# Patient Record
Sex: Female | Born: 1955
Health system: Southern US, Community
[De-identification: ages and names within clinical notes are randomized; demographics above are authoritative.]

## PROBLEM LIST (undated history)

## (undated) DIAGNOSIS — J302 Other seasonal allergic rhinitis: Secondary | ICD-10-CM

## (undated) DIAGNOSIS — M1990 Unspecified osteoarthritis, unspecified site: Secondary | ICD-10-CM

## (undated) DIAGNOSIS — E785 Hyperlipidemia, unspecified: Secondary | ICD-10-CM

## (undated) DIAGNOSIS — K219 Gastro-esophageal reflux disease without esophagitis: Secondary | ICD-10-CM

## (undated) HISTORY — PX: BUNIONECTOMY: SHX129

## (undated) HISTORY — PX: COLONOSCOPY: SHX174

---

## 2008-11-02 ENCOUNTER — Ambulatory Visit: Payer: Self-pay | Admitting: Cardiology

## 2019-01-23 DIAGNOSIS — M1611 Unilateral primary osteoarthritis, right hip: Secondary | ICD-10-CM | POA: Diagnosis not present

## 2019-01-23 DIAGNOSIS — Z299 Encounter for prophylactic measures, unspecified: Secondary | ICD-10-CM | POA: Diagnosis not present

## 2019-01-23 DIAGNOSIS — I10 Essential (primary) hypertension: Secondary | ICD-10-CM | POA: Diagnosis not present

## 2019-01-23 DIAGNOSIS — Z789 Other specified health status: Secondary | ICD-10-CM | POA: Diagnosis not present

## 2019-01-23 DIAGNOSIS — Z6824 Body mass index (BMI) 24.0-24.9, adult: Secondary | ICD-10-CM | POA: Diagnosis not present

## 2019-01-23 DIAGNOSIS — E7800 Pure hypercholesterolemia, unspecified: Secondary | ICD-10-CM | POA: Diagnosis not present

## 2019-02-01 DIAGNOSIS — I10 Essential (primary) hypertension: Secondary | ICD-10-CM | POA: Diagnosis not present

## 2019-02-01 DIAGNOSIS — Z6826 Body mass index (BMI) 26.0-26.9, adult: Secondary | ICD-10-CM | POA: Diagnosis not present

## 2019-02-01 DIAGNOSIS — Z299 Encounter for prophylactic measures, unspecified: Secondary | ICD-10-CM | POA: Diagnosis not present

## 2019-02-01 DIAGNOSIS — Z79899 Other long term (current) drug therapy: Secondary | ICD-10-CM | POA: Diagnosis not present

## 2019-02-01 DIAGNOSIS — R5383 Other fatigue: Secondary | ICD-10-CM | POA: Diagnosis not present

## 2019-02-01 DIAGNOSIS — Z1339 Encounter for screening examination for other mental health and behavioral disorders: Secondary | ICD-10-CM | POA: Diagnosis not present

## 2019-02-01 DIAGNOSIS — Z1211 Encounter for screening for malignant neoplasm of colon: Secondary | ICD-10-CM | POA: Diagnosis not present

## 2019-02-01 DIAGNOSIS — Z0000 Encounter for general adult medical examination without abnormal findings: Secondary | ICD-10-CM | POA: Diagnosis not present

## 2019-02-01 DIAGNOSIS — Z7189 Other specified counseling: Secondary | ICD-10-CM | POA: Diagnosis not present

## 2019-02-01 DIAGNOSIS — E7800 Pure hypercholesterolemia, unspecified: Secondary | ICD-10-CM | POA: Diagnosis not present

## 2019-02-01 DIAGNOSIS — Z1331 Encounter for screening for depression: Secondary | ICD-10-CM | POA: Diagnosis not present

## 2019-02-03 DIAGNOSIS — Z1231 Encounter for screening mammogram for malignant neoplasm of breast: Secondary | ICD-10-CM | POA: Diagnosis not present

## 2019-02-07 DIAGNOSIS — E2839 Other primary ovarian failure: Secondary | ICD-10-CM | POA: Diagnosis not present

## 2019-02-21 ENCOUNTER — Encounter (HOSPITAL_COMMUNITY): Payer: Self-pay

## 2019-02-21 NOTE — Patient Instructions (Addendum)
Your procedure is scheduled on: Tuesday, February 28, 2019   Surgery Time:  12:55PM-2:05PM   Report to [place]  Entrance    Report to admitting at 10:15 AM   Call this number if you have problems the morning of surgery [phone]   Do not eat food or drink liquids :After Midnight.   Brush your teeth the morning of surgery.   Do NOT smoke after Midnight   Take these medicines the morning of surgery with A SIP OF WATER: None                               You may not have any metal on your body including hair pins, jewelry, and body piercings             Do not wear make-up, lotions, powders, perfumes/cologne, or deodorant             Do not wear nail polish.  Do not shave  48 hours prior to surgery.                Do not bring valuables to the hospital. [place].   Contacts, dentures or bridgework may not be worn into surgery.   Leave suitcase in the car. After surgery it may be brought to your room.    Special Instructions: Bring a copy of your healthcare power of attorney and living will documents         the day of surgery if you haven't scanned them in before.              Please read over the following fact sheets you were given:  [facility] - Preparing for Surgery Before surgery, you can play an important role.  Because skin is not sterile, your skin needs to be as free of germs as possible.  You can reduce the number of germs on your skin by washing with CHG (chlorahexidine gluconate) soap before surgery.  CHG is an antiseptic cleaner which kills germs and bonds with the skin to continue killing germs even after washing. Please DO NOT use if you have an allergy to CHG or antibacterial soaps.  If your skin becomes reddened/irritated stop using the CHG and inform your nurse when you arrive at Short Stay. Do not shave (including legs and underarms) for at least 48 hours prior to the first CHG shower.  You  may shave your face/neck.  Please follow these instructions carefully:  1.  Shower with CHG Soap the night before surgery and the  morning of surgery.  2.  If you choose to wash your hair, wash your hair first as usual with your normal  shampoo.  3.  After you shampoo, rinse your hair and body thoroughly to remove the shampoo.                             4.  Use CHG as you would any other liquid soap.  You can apply chg directly to the skin and wash.  Gently with a scrungie or clean washcloth.  5.  Apply the CHG Soap to your body ONLY FROM THE NECK DOWN.   Do   not use on face/ open  Wound or open sores. Avoid contact with eyes, ears mouth and   genitals (private parts).                       Wash face,  Genitals (private parts) with your normal soap.             6.  Wash thoroughly, paying special attention to the area where your    surgery  will be performed.  7.  Thoroughly rinse your body with warm water from the neck down.  8.  DO NOT shower/wash with your normal soap after using and rinsing off the CHG Soap.                9.  Pat yourself dry with a clean towel.            10.  Wear clean pajamas.            11.  Place clean sheets on your bed the night of your first shower and do not  sleep with pets. Day of Surgery : Do not apply any lotions/deodorants the morning of surgery.  Please wear clean clothes to the hospital/surgery center.  FAILURE TO FOLLOW THESE INSTRUCTIONS MAY RESULT IN THE CANCELLATION OF YOUR SURGERY  PATIENT SIGNATURE_________________________________  NURSE SIGNATURE__________________________________  ________________________________________________________________________   Adam Phenix  An incentive spirometer is a tool that can help keep your lungs clear and active. This tool measures how well you are filling your lungs with each breath. Taking long deep breaths may help reverse or decrease the chance of developing breathing  (pulmonary) problems (especially infection) following:  A long period of time when you are unable to move or be active. BEFORE THE PROCEDURE   If the spirometer includes an indicator to show your best effort, your nurse or respiratory therapist will set it to a desired goal.  If possible, sit up straight or lean slightly forward. Try not to slouch.  Hold the incentive spirometer in an upright position. INSTRUCTIONS FOR USE  1. Sit on the edge of your bed if possible, or sit up as far as you can in bed or on a chair. 2. Hold the incentive spirometer in an upright position. 3. Breathe out normally. 4. Place the mouthpiece in your mouth and seal your lips tightly around it. 5. Breathe in slowly and as deeply as possible, raising the piston or the ball toward the top of the column. 6. Hold your breath for 3-5 seconds or for as long as possible. Allow the piston or ball to fall to the bottom of the column. 7. Remove the mouthpiece from your mouth and breathe out normally. 8. Rest for a few seconds and repeat Steps 1 through 7 at least 10 times every 1-2 hours when you are awake. Take your time and take a few normal breaths between deep breaths. 9. The spirometer may include an indicator to show your best effort. Use the indicator as a goal to work toward during each repetition. 10. After each set of 10 deep breaths, practice coughing to be sure your lungs are clear. If you have an incision (the cut made at the time of surgery), support your incision when coughing by placing a pillow or rolled up towels firmly against it. Once you are able to get out of bed, walk around indoors and cough well. You may stop using the incentive spirometer when instructed by your caregiver.  RISKS AND COMPLICATIONS  Take your time  so you do not get dizzy or light-headed.  If you are in pain, you may need to take or ask for pain medication before doing incentive spirometry. It is harder to take a deep breath if you  are having pain. AFTER USE  Rest and breathe slowly and easily.  It can be helpful to keep track of a log of your progress. Your caregiver can provide you with a simple table to help with this. If you are using the spirometer at home, follow these instructions: [place] IF:   You are having difficultly using the spirometer.  You have trouble using the spirometer as often as instructed.  Your pain medication is not giving enough relief while using the spirometer.  You develop fever of 100.5 F (38.1 C) or higher. SEEK IMMEDIATE MEDICAL CARE IF:   You cough up bloody sputum that had not been present before.  You develop fever of 102 F (38.9 C) or greater.  You develop worsening pain at or near the incision site. MAKE SURE YOU:   Understand these instructions.  Will watch your condition.  Will get help right away if you are not doing well or get worse. Document Released: 04/19/2007 Document Revised: 02/29/2012 Document Reviewed: 06/20/2007 ExitCare Patient Information 2014 Exit[employer], [place].   ________________________________________________________________________  WHAT IS A BLOOD TRANSFUSION? Blood Transfusion Information  A transfusion is the replacement of blood or some of its parts. Blood is made up of multiple cells which provide different functions.  Red blood cells carry oxygen and are used for blood loss replacement.  White blood cells fight against infection.  Platelets control bleeding.  Plasma helps clot blood.  Other blood products are available for specialized needs, such as hemophilia or other clotting disorders. BEFORE THE TRANSFUSION  Who gives blood for transfusions?   Healthy volunteers who are fully evaluated to make sure their blood is safe. This is blood bank blood. Transfusion therapy is the safest it has ever been in the practice of medicine. Before blood is taken from a donor, a complete history is taken to make sure that person has  no history of diseases nor engages in risky social behavior (examples are intravenous drug use or sexual activity with multiple partners). The donor's travel history is screened to minimize risk of transmitting infections, such as malaria. The donated blood is tested for signs of infectious diseases, such as HIV and hepatitis. The blood is then tested to be sure it is compatible with you in order to minimize the chance of a transfusion reaction. If you or a relative donates blood, this is often done in anticipation of surgery and is not appropriate for emergency situations. It takes many days to process the donated blood. RISKS AND COMPLICATIONS Although transfusion therapy is very safe and saves many lives, the main dangers of transfusion include:   Getting an infectious disease.  Developing a transfusion reaction. This is an allergic reaction to something in the blood you were given. Every precaution is taken to prevent this. The decision to have a blood transfusion has been considered carefully by your caregiver before blood is given. Blood is not given unless the benefits outweigh the risks. AFTER THE TRANSFUSION  Right after receiving a blood transfusion, you will usually feel much better and more energetic. This is especially true if your red blood cells have gotten low (anemic). The transfusion raises the level of the red blood cells which carry oxygen, and this usually causes an energy increase.  The  nurse administering the transfusion will monitor you carefully for complications. HOME CARE INSTRUCTIONS  No special instructions are needed after a transfusion. You may find your energy is better. Speak with your caregiver about any limitations on activity for underlying diseases you may have. SEEK MEDICAL CARE IF:   Your condition is not improving after your transfusion.  You develop redness or irritation at the intravenous (IV) site. SEEK IMMEDIATE MEDICAL CARE IF:  Any of the following  symptoms occur over the next 12 hours:  Shaking chills.  You have a temperature by mouth above 102 F (38.9 C), not controlled by medicine.  Chest, back, or muscle pain.  People around you feel you are not acting correctly or are confused.  Shortness of breath or difficulty breathing.  Dizziness and fainting.  You get a rash or develop hives.  You have a decrease in urine output.  Your urine turns a dark color or changes to pink, red, or brown. Any of the following symptoms occur over the next 10 days:  You have a temperature by mouth above 102 F (38.9 C), not controlled by medicine.  Shortness of breath.  Weakness after normal activity.  The white part of the eye turns yellow (jaundice).  You have a decrease in the amount of urine or are urinating less often.  Your urine turns a dark color or changes to pink, red, or brown. Document Released: 12/04/2000 Document Revised: 02/29/2012 Document Reviewed: 07/23/2008 [facility] Patient Information 2014 [place], [place].  _______________________________________________________________________

## 2019-02-21 NOTE — Pre-Procedure Instructions (Signed)
Surgical clearance from Dr. Manuella Ghazi 01/23/2019 on chart.

## 2019-02-22 ENCOUNTER — Encounter (HOSPITAL_COMMUNITY)
Admission: RE | Admit: 2019-02-22 | Discharge: 2019-02-22 | Disposition: A | Discharge status: Home / Self Care | Payer: Medicare Other | Source: Ambulatory Visit | Attending: Orthopedic Surgery | Admitting: Orthopedic Surgery

## 2019-02-22 ENCOUNTER — Other Ambulatory Visit: Payer: Self-pay

## 2019-02-22 ENCOUNTER — Encounter (HOSPITAL_COMMUNITY): Payer: Self-pay

## 2019-02-22 DIAGNOSIS — Z01812 Encounter for preprocedural laboratory examination: Secondary | ICD-10-CM | POA: Insufficient documentation

## 2019-02-22 DIAGNOSIS — M1611 Unilateral primary osteoarthritis, right hip: Secondary | ICD-10-CM | POA: Diagnosis not present

## 2019-02-22 HISTORY — DX: Other seasonal allergic rhinitis: J30.2

## 2019-02-22 HISTORY — DX: Unspecified osteoarthritis, unspecified site: M19.90

## 2019-02-22 HISTORY — DX: Hyperlipidemia, unspecified: E78.5

## 2019-02-22 LAB — CBC
HCT: 41.6 % (ref 36.0–46.0)
Hemoglobin: 12.9 g/dL (ref 12.0–15.0)
MCH: 35.2 pg — ABNORMAL HIGH (ref 26.0–34.0)
MCHC: 31 g/dL (ref 30.0–36.0)
MCV: 113.7 fL — ABNORMAL HIGH (ref 80.0–100.0)
PLATELETS: 176 10*3/uL (ref 150–400)
RBC: 3.66 MIL/uL — ABNORMAL LOW (ref 3.87–5.11)
RDW: 12.6 % (ref 11.5–15.5)
WBC: 10.1 10*3/uL (ref 4.0–10.5)
nRBC: 0 % (ref 0.0–0.2)

## 2019-02-22 LAB — ABO/RH: ABO/RH(D): O POS

## 2019-02-22 LAB — SURGICAL PCR SCREEN
MRSA, PCR: NEGATIVE
Staphylococcus aureus: NEGATIVE

## 2019-02-22 NOTE — H&P (Signed)
TOTAL HIP ADMISSION H&P  Patient is admitted for right total hip arthroplasty, anterior approach.  Subjective:  Chief Complaint: Right hip primary OA / pain  HPI: Jamie Alexander, 63 y.o. female, has a history of pain and functional disability in the right hip(s) due to arthritis and patient has failed non-surgical conservative treatments for greater than 12 weeks to include NSAID's and/or analgesics, corticosteriod injections and activity modification.  Onset of symptoms was gradual starting 2 years ago with gradually worsening course since that time.The patient noted no past surgery on the right hip(s).  Patient currently rates pain in the right hip at 10 out of 10 with activity. Patient has night pain, worsening of pain with activity and weight bearing, trendelenberg gait, pain that interfers with activities of daily living and pain with passive range of motion. Patient has evidence of periarticular osteophytes and joint space narrowing by imaging studies. This condition presents safety issues increasing the risk of falls.  There is no current active infection.  Risks, benefits and expectations were discussed with the patient.  Risks including but not limited to the risk of anesthesia, blood clots, nerve damage, blood vessel damage, failure of the prosthesis, infection and up to and including death.  Patient understand the risks, benefits and expectations and wishes to proceed with surgery.   PCP: [facility]  D/C Plans:       Home  Post-op Meds:       No Rx given  Tranexamic Acid:      To be given - IV   Decadron:      Is to be given  FYI:      ASA  Norco  DME:   Rx given for - RW   PT:  HEP  Pharm:  [facility] [place], [place]    Past Medical History:  Diagnosis Date  . Hyperlipidemia   . Seasonal allergies       No current facility-administered medications for this encounter.    Current Outpatient Medications  Medication Sig Dispense Refill Last Dose  .  acetaminophen (TYLENOL) 500 MG tablet Take 1,000 mg by mouth every 6 (six) hours as needed for moderate pain.     Marland Kitchen aspirin EC 81 MG tablet Take 81 mg by mouth daily.     . Cholecalciferol (VITAMIN D3) 125 MCG (5000 UT) CAPS Take 5,000 Units by mouth daily.     . Coenzyme Q10 (COQ-10) 100 MG CAPS Take 100 mg by mouth daily.     . diclofenac (VOLTAREN) 75 MG EC tablet Take 75 mg by mouth 2 (two) times daily.     Marland Kitchen loratadine (CLARITIN) 10 MG tablet Take 10 mg by mouth daily.     . Multiple Vitamin (MULTIVITAMIN WITH MINERALS) TABS tablet Take 1 tablet by mouth daily.     . Omega-3 Fatty Acids (FISH OIL) 1200 MG CAPS Take 1,200 mg by mouth daily.     . psyllium (REGULOID) 0.52 g capsule Take 0.52 g by mouth daily.     . rosuvastatin (CRESTOR) 5 MG tablet Take 5 mg by mouth every other day.      No Known Allergies   Social History   Tobacco Use  . Smoking status: Not on file  Substance Use Topics  . Alcohol use: Not on file       Review of Systems  Constitutional: Negative.   HENT: Negative.   Eyes: Negative.   Respiratory: Negative.   Cardiovascular: Negative.   Gastrointestinal: Negative.  Genitourinary: Negative.   Musculoskeletal: Positive for joint pain.  Skin: Negative.   Neurological: Negative.   Endo/Heme/Allergies: Positive for environmental allergies.  Psychiatric/Behavioral: Negative.     Objective:  Physical Exam  Constitutional: She is oriented to person, place, and time. She appears well-developed.  HENT:  Head: Normocephalic.  Eyes: Pupils are equal, round, and reactive to light.  Neck: Neck supple. No JVD present. No tracheal deviation present. No thyromegaly present.  Cardiovascular: Normal rate, regular rhythm and intact distal pulses.  Respiratory: Effort normal and breath sounds normal. No respiratory distress. She has no wheezes.  GI: Soft. There is no abdominal tenderness. There is no guarding.  Musculoskeletal:     Right hip: She exhibits  decreased range of motion, decreased strength, tenderness and bony tenderness. She exhibits no swelling, no deformity and no laceration.  Lymphadenopathy:    She has no cervical adenopathy.  Neurological: She is alert and oriented to person, place, and time.  Skin: Skin is warm and dry.  Psychiatric: She has a normal mood and affect.      Imaging Review Plain radiographs demonstrate severe degenerative joint disease of the right hip(s). The bone quality appears to be good for age and reported activity level.    Assessment/Plan:  End stage arthritis, right hip  The patient history, physical examination, clinical judgement of the provider and imaging studies are consistent with end stage degenerative joint disease of the right hip and total hip arthroplasty is deemed medically necessary. The treatment options including medical management, injection therapy, arthroscopy and arthroplasty were discussed at length. The risks and benefits of total hip arthroplasty were presented and reviewed. The risks due to aseptic loosening, infection, stiffness, dislocation/subluxation,  thromboembolic complications and other imponderables were discussed.  The patient acknowledged the explanation, agreed to proceed with the plan and consent was signed. Patient is being admitted for inpatient treatment for surgery, pain control, PT, OT, prophylactic antibiotics, VTE prophylaxis, progressive ambulation and ADL's and discharge planning.The patient is planning to be discharged home.     West Pugh Keyron Pokorski   PA-C  02/22/2019, 11:48 AM

## 2019-02-22 NOTE — Pre-Procedure Instructions (Signed)
CBC results 02/22/2019 sent to Dr. Alvan Dame via epic.

## 2019-02-28 ENCOUNTER — Telehealth (HOSPITAL_COMMUNITY): Payer: Self-pay | Admitting: *Deleted

## 2019-02-28 ENCOUNTER — Inpatient Hospital Stay (HOSPITAL_COMMUNITY): Payer: Medicare Other

## 2019-02-28 ENCOUNTER — Other Ambulatory Visit: Payer: Self-pay

## 2019-02-28 ENCOUNTER — Encounter (HOSPITAL_COMMUNITY)
Admission: RE | Disposition: A | Discharge status: Home / Self Care | Payer: Self-pay | Source: Home / Self Care | Attending: Orthopedic Surgery

## 2019-02-28 ENCOUNTER — Inpatient Hospital Stay (HOSPITAL_COMMUNITY): Payer: Medicare Other | Admitting: Physician Assistant

## 2019-02-28 ENCOUNTER — Inpatient Hospital Stay (HOSPITAL_COMMUNITY): Payer: Medicare Other | Admitting: Registered Nurse

## 2019-02-28 ENCOUNTER — Encounter (HOSPITAL_COMMUNITY): Payer: Self-pay | Admitting: *Deleted

## 2019-02-28 ENCOUNTER — Inpatient Hospital Stay (HOSPITAL_COMMUNITY)
Admission: RE | Admit: 2019-02-28 | Discharge: 2019-03-01 | DRG: 470 | Disposition: A | Discharge status: Home / Self Care | Payer: Medicare Other | Attending: Orthopedic Surgery | Admitting: Orthopedic Surgery

## 2019-02-28 DIAGNOSIS — Z96641 Presence of right artificial hip joint: Secondary | ICD-10-CM

## 2019-02-28 DIAGNOSIS — E785 Hyperlipidemia, unspecified: Secondary | ICD-10-CM | POA: Diagnosis present

## 2019-02-28 DIAGNOSIS — Z79899 Other long term (current) drug therapy: Secondary | ICD-10-CM

## 2019-02-28 DIAGNOSIS — Z96649 Presence of unspecified artificial hip joint: Secondary | ICD-10-CM

## 2019-02-28 DIAGNOSIS — M1611 Unilateral primary osteoarthritis, right hip: Principal | ICD-10-CM | POA: Diagnosis present

## 2019-02-28 DIAGNOSIS — J302 Other seasonal allergic rhinitis: Secondary | ICD-10-CM | POA: Diagnosis present

## 2019-02-28 DIAGNOSIS — Z7982 Long term (current) use of aspirin: Secondary | ICD-10-CM

## 2019-02-28 DIAGNOSIS — Z419 Encounter for procedure for purposes other than remedying health state, unspecified: Secondary | ICD-10-CM

## 2019-02-28 DIAGNOSIS — Z471 Aftercare following joint replacement surgery: Secondary | ICD-10-CM | POA: Diagnosis not present

## 2019-02-28 HISTORY — PX: TOTAL HIP ARTHROPLASTY: SHX124

## 2019-02-28 LAB — TYPE AND SCREEN
ABO/RH(D): O POS
Antibody Screen: NEGATIVE

## 2019-02-28 SURGERY — ARTHROPLASTY, HIP, TOTAL, ANTERIOR APPROACH
Anesthesia: Spinal | Site: Hip | Laterality: Right

## 2019-02-28 MED ORDER — METOCLOPRAMIDE HCL 5 MG/ML IJ SOLN: 5.0000 mg | Freq: Three times a day (TID) | INTRAMUSCULAR | Status: DC | PRN

## 2019-02-28 MED ORDER — POLYETHYLENE GLYCOL 3350 17 G PO PACK
17.0000 g | PACK | Freq: Two times a day (BID) | ORAL | Status: DC
  Administered 2019-02-28 – 2019-03-01 (×2): 17 g via ORAL
  Filled 2019-02-28 (×2): qty 1

## 2019-02-28 MED ORDER — MENTHOL 3 MG MT LOZG: 1.0000 | LOZENGE | OROMUCOSAL | Status: DC | PRN

## 2019-02-28 MED ORDER — ONDANSETRON HCL 4 MG/2ML IJ SOLN
INTRAMUSCULAR | Status: AC
  Filled 2019-02-28: qty 2

## 2019-02-28 MED ORDER — METOCLOPRAMIDE HCL 5 MG PO TABS: 5.0000 mg | ORAL_TABLET | Freq: Three times a day (TID) | ORAL | Status: DC | PRN

## 2019-02-28 MED ORDER — LACTATED RINGERS IV SOLN
INTRAVENOUS | Status: DC
  Administered 2019-02-28: 11:00:00 via INTRAVENOUS

## 2019-02-28 MED ORDER — ONDANSETRON HCL 4 MG PO TABS: 4.0000 mg | ORAL_TABLET | Freq: Four times a day (QID) | ORAL | Status: DC | PRN

## 2019-02-28 MED ORDER — DEXAMETHASONE SODIUM PHOSPHATE 10 MG/ML IJ SOLN
10.0000 mg | Freq: Once | INTRAMUSCULAR | Status: AC
  Administered 2019-02-28: 8 mg via INTRAVENOUS

## 2019-02-28 MED ORDER — PHENOL 1.4 % MT LIQD
1.0000 | OROMUCOSAL | Status: DC | PRN
  Filled 2019-02-28: qty 177

## 2019-02-28 MED ORDER — MIDAZOLAM HCL 5 MG/5ML IJ SOLN
INTRAMUSCULAR | Status: DC | PRN
  Administered 2019-02-28: 2 mg via INTRAVENOUS

## 2019-02-28 MED ORDER — ACETAMINOPHEN 325 MG PO TABS: 325.0000 mg | ORAL_TABLET | Freq: Four times a day (QID) | ORAL | Status: DC | PRN

## 2019-02-28 MED ORDER — FERROUS SULFATE 325 (65 FE) MG PO TABS
325.0000 mg | ORAL_TABLET | Freq: Three times a day (TID) | ORAL | Status: DC
  Administered 2019-03-01: 325 mg via ORAL
  Filled 2019-02-28: qty 1

## 2019-02-28 MED ORDER — TRANEXAMIC ACID-NACL 1000-0.7 MG/100ML-% IV SOLN
1000.0000 mg | INTRAVENOUS | Status: AC
  Administered 2019-02-28: 1000 mg via INTRAVENOUS

## 2019-02-28 MED ORDER — PROPOFOL 10 MG/ML IV BOLUS
INTRAVENOUS | Status: AC
  Filled 2019-02-28: qty 40

## 2019-02-28 MED ORDER — MAGNESIUM CITRATE PO SOLN: 1.0000 | Freq: Once | ORAL | Status: DC | PRN

## 2019-02-28 MED ORDER — DOCUSATE SODIUM 100 MG PO CAPS
100.0000 mg | ORAL_CAPSULE | Freq: Two times a day (BID) | ORAL | Status: DC
  Administered 2019-02-28 – 2019-03-01 (×2): 100 mg via ORAL
  Filled 2019-02-28 (×2): qty 1

## 2019-02-28 MED ORDER — SODIUM CHLORIDE 0.9 % IR SOLN
Status: DC | PRN
  Administered 2019-02-28: 1000 mL

## 2019-02-28 MED ORDER — EPHEDRINE SULFATE-NACL 50-0.9 MG/10ML-% IV SOSY
PREFILLED_SYRINGE | INTRAVENOUS | Status: DC | PRN
  Administered 2019-02-28 (×3): 5 mg via INTRAVENOUS

## 2019-02-28 MED ORDER — DEXAMETHASONE SODIUM PHOSPHATE 10 MG/ML IJ SOLN
10.0000 mg | Freq: Once | INTRAMUSCULAR | Status: AC
  Administered 2019-03-01: 10 mg via INTRAVENOUS
  Filled 2019-02-28: qty 1

## 2019-02-28 MED ORDER — HYDROCODONE-ACETAMINOPHEN 5-325 MG PO TABS
1.0000 | ORAL_TABLET | ORAL | Status: DC | PRN
  Administered 2019-02-28 (×2): 1 via ORAL
  Administered 2019-03-01 (×2): 2 via ORAL
  Filled 2019-02-28: qty 1
  Filled 2019-02-28 (×2): qty 2
  Filled 2019-02-28: qty 1

## 2019-02-28 MED ORDER — DEXAMETHASONE SODIUM PHOSPHATE 10 MG/ML IJ SOLN
INTRAMUSCULAR | Status: AC
  Filled 2019-02-28: qty 1

## 2019-02-28 MED ORDER — CEFAZOLIN SODIUM-DEXTROSE 2-4 GM/100ML-% IV SOLN
2.0000 g | Freq: Four times a day (QID) | INTRAVENOUS | Status: AC
  Administered 2019-02-28 – 2019-03-01 (×2): 2 g via INTRAVENOUS
  Filled 2019-02-28 (×2): qty 100

## 2019-02-28 MED ORDER — FENTANYL CITRATE (PF) 100 MCG/2ML IJ SOLN
INTRAMUSCULAR | Status: DC | PRN
  Administered 2019-02-28: 50 ug via INTRAVENOUS

## 2019-02-28 MED ORDER — STERILE WATER FOR IRRIGATION IR SOLN
Status: DC | PRN
  Administered 2019-02-28: 2000 mL

## 2019-02-28 MED ORDER — BUPIVACAINE IN DEXTROSE 0.75-8.25 % IT SOLN
INTRATHECAL | Status: DC | PRN
  Administered 2019-02-28: 1.8 mL via INTRATHECAL

## 2019-02-28 MED ORDER — ALUM & MAG HYDROXIDE-SIMETH 200-200-20 MG/5ML PO SUSP: 15.0000 mL | ORAL | Status: DC | PRN

## 2019-02-28 MED ORDER — CHLORHEXIDINE GLUCONATE 4 % EX LIQD: 60.0000 mL | Freq: Once | CUTANEOUS | Status: DC

## 2019-02-28 MED ORDER — EPHEDRINE 5 MG/ML INJ
INTRAVENOUS | Status: AC
  Filled 2019-02-28: qty 10

## 2019-02-28 MED ORDER — HYDROCODONE-ACETAMINOPHEN 7.5-325 MG PO TABS
1.0000 | ORAL_TABLET | ORAL | Status: DC | PRN
  Administered 2019-02-28: 2 via ORAL
  Filled 2019-02-28 (×2): qty 2

## 2019-02-28 MED ORDER — FENTANYL CITRATE (PF) 100 MCG/2ML IJ SOLN: 25.0000 ug | INTRAMUSCULAR | Status: DC | PRN

## 2019-02-28 MED ORDER — ONDANSETRON HCL 4 MG/2ML IJ SOLN: 4.0000 mg | Freq: Four times a day (QID) | INTRAMUSCULAR | Status: DC | PRN

## 2019-02-28 MED ORDER — METHOCARBAMOL 500 MG IVPB - SIMPLE MED
INTRAVENOUS | Status: AC
  Filled 2019-02-28: qty 50

## 2019-02-28 MED ORDER — PROPOFOL 10 MG/ML IV BOLUS
INTRAVENOUS | Status: AC
  Filled 2019-02-28: qty 20

## 2019-02-28 MED ORDER — SODIUM CHLORIDE 0.9 % IV SOLN
INTRAVENOUS | Status: DC
  Administered 2019-02-28 – 2019-03-01 (×2): via INTRAVENOUS

## 2019-02-28 MED ORDER — MORPHINE SULFATE (PF) 2 MG/ML IV SOLN: 0.5000 mg | INTRAVENOUS | Status: DC | PRN

## 2019-02-28 MED ORDER — MIDAZOLAM HCL 2 MG/2ML IJ SOLN
INTRAMUSCULAR | Status: AC
  Filled 2019-02-28: qty 2

## 2019-02-28 MED ORDER — ASPIRIN 81 MG PO CHEW
81.0000 mg | CHEWABLE_TABLET | Freq: Two times a day (BID) | ORAL | Status: DC
  Administered 2019-02-28 – 2019-03-01 (×2): 81 mg via ORAL
  Filled 2019-02-28 (×2): qty 1

## 2019-02-28 MED ORDER — BISACODYL 10 MG RE SUPP: 10.0000 mg | Freq: Every day | RECTAL | Status: DC | PRN

## 2019-02-28 MED ORDER — TRANEXAMIC ACID-NACL 1000-0.7 MG/100ML-% IV SOLN
1000.0000 mg | Freq: Once | INTRAVENOUS | Status: AC
  Administered 2019-02-28: 1000 mg via INTRAVENOUS
  Filled 2019-02-28: qty 100

## 2019-02-28 MED ORDER — CEFAZOLIN SODIUM-DEXTROSE 2-4 GM/100ML-% IV SOLN
2.0000 g | INTRAVENOUS | Status: AC
  Administered 2019-02-28: 2 g via INTRAVENOUS
  Filled 2019-02-28: qty 100

## 2019-02-28 MED ORDER — METHOCARBAMOL 500 MG IVPB - SIMPLE MED
500.0000 mg | Freq: Four times a day (QID) | INTRAVENOUS | Status: DC | PRN
  Administered 2019-02-28: 500 mg via INTRAVENOUS
  Filled 2019-02-28: qty 50

## 2019-02-28 MED ORDER — ONDANSETRON HCL 4 MG/2ML IJ SOLN
INTRAMUSCULAR | Status: DC | PRN
  Administered 2019-02-28: 4 mg via INTRAVENOUS

## 2019-02-28 MED ORDER — DIPHENHYDRAMINE HCL 12.5 MG/5ML PO ELIX: 12.5000 mg | ORAL_SOLUTION | ORAL | Status: DC | PRN

## 2019-02-28 MED ORDER — ACETAMINOPHEN 500 MG PO TABS
1000.0000 mg | ORAL_TABLET | Freq: Once | ORAL | Status: AC
  Administered 2019-02-28: 1000 mg via ORAL
  Filled 2019-02-28: qty 2

## 2019-02-28 MED ORDER — FENTANYL CITRATE (PF) 100 MCG/2ML IJ SOLN
INTRAMUSCULAR | Status: AC
  Filled 2019-02-28: qty 2

## 2019-02-28 MED ORDER — PROPOFOL 500 MG/50ML IV EMUL
INTRAVENOUS | Status: DC | PRN
  Administered 2019-02-28: 40 ug/kg/min via INTRAVENOUS

## 2019-02-28 MED ORDER — METHOCARBAMOL 500 MG PO TABS
500.0000 mg | ORAL_TABLET | Freq: Four times a day (QID) | ORAL | Status: DC | PRN
  Administered 2019-02-28: 500 mg via ORAL
  Filled 2019-02-28: qty 1

## 2019-02-28 SURGICAL SUPPLY — 52 items
BAG DECANTER FOR FLEXI CONT (MISCELLANEOUS) IMPLANT
BAG ZIPLOCK 12X15 (MISCELLANEOUS) IMPLANT
BALL HIP CERAMIC (Hips) ×1 IMPLANT
BLADE SAG 18X100X1.27 (BLADE) ×3 IMPLANT
BLADE SURG SZ10 CARB STEEL (BLADE) ×6 IMPLANT
CHLORAPREP W/TINT 26 (MISCELLANEOUS) ×3 IMPLANT
COVER PERINEAL POST (MISCELLANEOUS) ×3 IMPLANT
COVER SURGICAL LIGHT HANDLE (MISCELLANEOUS) ×3 IMPLANT
COVER WAND RF STERILE (DRAPES) IMPLANT
CUP ACET PINNACLE SECTR 50MM (Hips) ×1 IMPLANT
DERMABOND ADVANCED (GAUZE/BANDAGES/DRESSINGS) ×2
DERMABOND ADVANCED .7 DNX12 (GAUZE/BANDAGES/DRESSINGS) ×1 IMPLANT
DRAPE STERI IOBAN 125X83 (DRAPES) ×3 IMPLANT
DRAPE U-SHAPE 47X51 STRL (DRAPES) ×6 IMPLANT
DRESSING AQUACEL AG SP 3.5X10 (GAUZE/BANDAGES/DRESSINGS) ×1 IMPLANT
DRSG AQUACEL AG SP 3.5X10 (GAUZE/BANDAGES/DRESSINGS) ×3
ELECT REM PT RETURN 15FT ADLT (MISCELLANEOUS) ×3 IMPLANT
ELIMINATOR HOLE APEX DEPUY (Hips) ×3 IMPLANT
GLOVE BIO SURGEON STRL SZ7 (GLOVE) ×3 IMPLANT
GLOVE BIOGEL PI IND STRL 7.0 (GLOVE) ×1 IMPLANT
GLOVE BIOGEL PI IND STRL 7.5 (GLOVE) ×2 IMPLANT
GLOVE BIOGEL PI IND STRL 8.5 (GLOVE) IMPLANT
GLOVE BIOGEL PI INDICATOR 7.0 (GLOVE) ×2
GLOVE BIOGEL PI INDICATOR 7.5 (GLOVE) ×4
GLOVE BIOGEL PI INDICATOR 8.5 (GLOVE)
GLOVE ECLIPSE 8.0 STRL XLNG CF (GLOVE) IMPLANT
GLOVE ORTHO TXT STRL SZ7.5 (GLOVE) ×3 IMPLANT
GLOVE SURG SS PI 7.5 STRL IVOR (GLOVE) ×3 IMPLANT
GOWN STRL REIN 2XL XLG LVL4 (GOWN DISPOSABLE) ×3 IMPLANT
GOWN STRL REUS W/TWL 2XL LVL3 (GOWN DISPOSABLE) IMPLANT
GOWN STRL REUS W/TWL LRG LVL3 (GOWN DISPOSABLE) ×3 IMPLANT
GOWN STRL REUS W/TWL LRG LVL4 (GOWN DISPOSABLE) ×3 IMPLANT
HIP BALL CERAMIC (Hips) ×3 IMPLANT
HOLDER FOLEY CATH W/STRAP (MISCELLANEOUS) ×3 IMPLANT
KIT TURNOVER KIT A (KITS) IMPLANT
LINER ACET PNNCL PLUS4 NEUTRAL (Hips) ×1 IMPLANT
PACK ANTERIOR HIP CUSTOM (KITS) ×3 IMPLANT
PINNACLE PLUS 4 NEUTRAL (Hips) ×3 IMPLANT
PINNACLE SECTOR CUP 50MM (Hips) ×3 IMPLANT
SCREW 6.5MMX25MM (Screw) ×3 IMPLANT
STEM TRI LOC GRIPTION SZ 4 STD (Hips) ×1 IMPLANT
SUT MNCRL AB 4-0 PS2 18 (SUTURE) ×3 IMPLANT
SUT STRATAFIX 0 PDS 27 VIOLET (SUTURE) ×3
SUT VIC AB 1 CT1 36 (SUTURE) ×9 IMPLANT
SUT VIC AB 2-0 CT1 27 (SUTURE) ×4
SUT VIC AB 2-0 CT1 TAPERPNT 27 (SUTURE) ×2 IMPLANT
SUTURE STRATFX 0 PDS 27 VIOLET (SUTURE) ×1 IMPLANT
TRAY FOLEY CATH 14FRSI W/METER (CATHETERS) ×3 IMPLANT
TRAY FOLEY MTR SLVR 16FR STAT (SET/KITS/TRAYS/PACK) IMPLANT
TRI LOC GRIPTION SZ 4 STD (Hips) ×3 IMPLANT
WATER STERILE IRR 1000ML POUR (IV SOLUTION) ×3 IMPLANT
YANKAUER SUCT BULB TIP 10FT TU (MISCELLANEOUS) ×3 IMPLANT

## 2019-02-28 NOTE — Discharge Instructions (Signed)

## 2019-02-28 NOTE — Transfer of Care (Signed)
Immediate Anesthesia Transfer of Care Note  Patient: Jamie Alexander  Procedure(s) Performed: TOTAL HIP ARTHROPLASTY ANTERIOR APPROACH (Right Hip)  Patient Location: PACU  Anesthesia Type:MAC and Spinal  Level of Consciousness: awake, alert , oriented and patient cooperative  Airway & Oxygen Therapy: Patient Spontanous Breathing and Patient connected to face mask oxygen  Post-op Assessment: Report given to RN and Post -op Vital signs reviewed and stable  Post vital signs: Reviewed and stable  Last Vitals:  Vitals Value Taken Time  BP 98/69 02/28/2019  2:35 PM  Temp    Pulse 87 02/28/2019  2:36 PM  Resp 17 02/28/2019  2:36 PM  SpO2 100 % 02/28/2019  2:36 PM  Vitals shown include unvalidated device data.  Last Pain: There were no vitals filed for this visit.       Complications: No apparent anesthesia complications

## 2019-02-28 NOTE — Anesthesia Procedure Notes (Signed)
Spinal  Patient location during procedure: OR Start time: 02/28/2019 12:35 PM End time: 02/28/2019 12:45 PM Staffing Anesthesiologist: Freddrick March, MD Performed: anesthesiologist  Preanesthetic Checklist Completed: patient identified, surgical consent, pre-op evaluation, timeout performed, IV checked, risks and benefits discussed and monitors and equipment checked Spinal Block Patient position: sitting Prep: site prepped and draped and DuraPrep Patient monitoring: cardiac monitor, continuous pulse ox and blood pressure Approach: midline Location: L3-4 Injection technique: single-shot Needle Needle type: Pencan  Needle gauge: 24 G Needle length: 9 cm Assessment Sensory level: T6 Additional Notes Functioning IV was confirmed and monitors were applied. Sterile prep and drape, including hand hygiene and sterile gloves were used. The patient was positioned and the spine was prepped. The skin was anesthetized with lidocaine.  Free flow of clear CSF was obtained prior to injecting local anesthetic into the CSF.  The spinal needle aspirated freely following injection.  The needle was carefully withdrawn.  The patient tolerated the procedure well.

## 2019-02-28 NOTE — Anesthesia Postprocedure Evaluation (Signed)
Anesthesia Post Note  Patient: Jamie Alexander  Procedure(s) Performed: TOTAL HIP ARTHROPLASTY ANTERIOR APPROACH (Right Hip)     Patient location during evaluation: PACU Anesthesia Type: Spinal Level of consciousness: oriented and awake and alert Pain management: pain level controlled Vital Signs Assessment: post-procedure vital signs reviewed and stable Respiratory status: spontaneous breathing, respiratory function stable and patient connected to nasal cannula oxygen Cardiovascular status: blood pressure returned to baseline and stable Postop Assessment: no headache, no backache and no apparent nausea or vomiting Anesthetic complications: no    Last Vitals:  Vitals:   02/28/19 1640 02/28/19 1734  BP: 112/72 112/77  Pulse: 85 96  Resp: 14 16  Temp: 36.4 C (!) 36.4 C  SpO2: 100% 99%    Last Pain:  Vitals:   02/28/19 1544  TempSrc: Oral  PainSc:                  Chelsey L Woodrum

## 2019-02-28 NOTE — Interval H&P Note (Signed)
History and Physical Interval Note:  02/28/2019 11:13 AM  Jamie Alexander  has presented today for surgery, with the diagnosis of right hip osteoarthritis.  The various methods of treatment have been discussed with the patient and family. After consideration of risks, benefits and other options for treatment, the patient has consented to  Procedure(s): TOTAL HIP ARTHROPLASTY ANTERIOR APPROACH (Right) as a surgical intervention.  The patient's history has been reviewed, patient examined, no change in status, stable for surgery.  I have reviewed the patient's chart and labs.  Questions were answered to the patient's satisfaction.     Mauri Pole

## 2019-02-28 NOTE — Evaluation (Signed)
Physical Therapy Evaluation Patient Details Name: Jamie Alexander MRN: [id number] DOB: September 21, 1956 Today's Date: 02/28/2019   History of Present Illness  63 yo female s/p R DA-THA on 02/28/19. PMH includes HLD, seasonal allergies.   Clinical Impression  Pt presents with mild R hip pain, difficulty performing mobility tasks, and decreased activity tolerance. Pt to benefit from acute PT to address deficits. Pt able to perform stand pivot to recliner, taking a few small steps. Pt unable to ambulate this session due to decreased sensation in feet once standing. Pt educated on ankle pumps (20/hour) to perform this afternoon/evening to increase circulation, to pt's tolerance and limited by pain. PT to progress mobility as tolerated, and will continue to follow acutely.        Follow Up Recommendations Follow surgeon's recommendation for DC plan and follow-up therapies;Supervision for mobility/OOB    Equipment Recommendations  None recommended by PT    Recommendations for Other Services       Precautions / Restrictions Precautions Precautions: Fall Restrictions Weight Bearing Restrictions: No Other Position/Activity Restrictions: WBAT       Mobility  Bed Mobility Overal bed mobility: Needs Assistance Bed Mobility: Supine to Sit     Supine to sit: Min assist;HOB elevated     General bed mobility comments: Min assist for RLE lifting and translation to EOB. Increased time and effort.   Transfers Overall transfer level: Needs assistance Equipment used: Rolling walker (2 wheeled) Transfers: Sit to/from [employer] Sit to Stand: Min guard;From elevated surface Stand pivot transfers: Min guard;From elevated surface       General transfer comment: min guard for safety, verbal cuing for placement of hands when rising to standing. Pt reporting "I feel like there are balloons in my feet" upon standing. PT deferred ambulation due to decreased foot sensation in  standing, but pt able to take small steps and turn to recliner safely.   Ambulation/Gait Ambulation/Gait assistance: (Nt- decreased sensation of bilateral feet noted in standing)              Stairs            Wheelchair Mobility    Modified Rankin (Stroke Patients Only)       Balance Overall balance assessment: Mild deficits observed, not formally tested                                           Pertinent Vitals/Pain Pain Assessment: 0-10 Pain Score: 3  Pain Location: R hip  Pain Descriptors / Indicators: Sore Pain Intervention(s): Limited activity within patient's tolerance;Repositioned;Ice applied;Monitored during session;Premedicated before session    Home Living Family/patient expects to be discharged to:: Private residence Living Arrangements: Spouse/significant other Available Help at Discharge: Family;Available PRN/intermittently Type of Home: House Home Access: Stairs to enter   Entrance Stairs-Number of Steps: 5 Home Layout: Laundry or work area in basement;One level Home Equipment: Cos Cob - 2 wheels;Cane - single point;Bedside commode      Prior Function Level of Independence: Independent               Hand Dominance   Dominant Hand: Right    Extremity/Trunk Assessment   Upper Extremity Assessment Upper Extremity Assessment: Overall WFL for tasks assessed    Lower Extremity Assessment Lower Extremity Assessment: Overall WFL for tasks assessed;RLE deficits/detail RLE Deficits / Details: suspected post-surgical weakness; able to perform ankle  pumps, quad set, heel slide  RLE Sensation: WNL(decreased light touch of bilateral feet in standing, pt reporting WNL in supine)    Cervical / Trunk Assessment Cervical / Trunk Assessment: Normal  Communication   Communication: No difficulties  Cognition Arousal/Alertness: Awake/alert Behavior During Therapy: WFL for tasks assessed/performed Overall Cognitive Status:  Within Functional Limits for tasks assessed                                        General Comments      Exercises     Assessment/Plan    PT Assessment Patient needs continued PT services  PT Problem List Decreased strength;Decreased mobility;Decreased activity tolerance;Decreased balance;Decreased knowledge of use of DME;Pain       PT Treatment Interventions DME instruction;Therapeutic activities;Gait training;Therapeutic exercise;Patient/family education;Balance training;Stair training;Functional mobility training    PT Goals (Current goals can be found in the Care Plan section)  Acute Rehab PT Goals Patient Stated Goal: none stated  PT Goal Formulation: With patient Time For Goal Achievement: 03/07/19 Potential to Achieve Goals: Good    Frequency 7X/week   Barriers to discharge        Co-evaluation               AM-PAC PT "6 Clicks" Mobility  Outcome Measure Help needed turning from your back to your side while in a flat bed without using bedrails?: A Little Help needed moving from lying on your back to sitting on the side of a flat bed without using bedrails?: A Little Help needed moving to and from a bed to a chair (including a wheelchair)?: A Little Help needed standing up from a chair using your arms (e.g., wheelchair or bedside chair)?: A Little Help needed to walk in hospital room?: A Lot Help needed climbing 3-5 steps with a railing? : A Lot 6 Click Score: 16    End of Session Equipment Utilized During Treatment: Gait belt Activity Tolerance: Patient tolerated treatment well;Treatment limited secondary to medical complications (Comment) Patient left: in chair;with chair alarm set;with call bell/phone within reach;with family/visitor present;with SCD's reapplied Nurse Communication: Mobility status PT Visit Diagnosis: Other abnormalities of gait and mobility (R26.89);Difficulty in walking, not elsewhere classified (R26.2)    Time:  1771-1657 PT Time Calculation (min) (ACUTE ONLY): 18 min   Charges:   PT Evaluation $PT Eval Low Complexity: 1 Low         Haeleigh Streiff Conception Chancy, PT Acute Rehabilitation Services Pager [phone]  Office [phone]   Jibri Schriefer D Elonda Husky 02/28/2019, 7:17 PM

## 2019-02-28 NOTE — Plan of Care (Signed)

## 2019-02-28 NOTE — Anesthesia Preprocedure Evaluation (Addendum)
Anesthesia Evaluation  Patient identified by MRN, date of birth, ID band Patient awake    Reviewed: Allergy & Precautions, NPO status , Patient's Chart, lab work & pertinent test results  Airway Mallampati: II  TM Distance: >3 FB Neck ROM: Full    Dental no notable dental hx. (+) Teeth Intact, Dental Advisory Given   Pulmonary neg pulmonary ROS,    Pulmonary exam normal breath sounds clear to auscultation       Cardiovascular negative cardio ROS Normal cardiovascular exam Rhythm:Regular Rate:Normal     Neuro/Psych negative neurological ROS  negative psych ROS   GI/Hepatic negative GI ROS, Neg liver ROS,   Endo/Other  negative endocrine ROS  Renal/GU negative Renal ROS  negative genitourinary   Musculoskeletal  (+) Arthritis , Osteoarthritis,    Abdominal   Peds  Hematology negative hematology ROS (+)   Anesthesia Other Findings   Reproductive/Obstetrics negative OB ROS                            Anesthesia Physical Anesthesia Plan  ASA: II  Anesthesia Plan: Spinal   Post-op Pain Management:    Induction:   PONV Risk Score and Plan: 2 and Treatment may vary due to age or medical condition, Ondansetron and Dexamethasone  Airway Management Planned: Natural Airway and Simple Face Mask  Additional Equipment:   Intra-op Plan:   Post-operative Plan:   Informed Consent: I have reviewed the patients History and Physical, chart, labs and discussed the procedure including the risks, benefits and alternatives for the proposed anesthesia with the patient or authorized representative who has indicated his/her understanding and acceptance.     Dental advisory given  Plan Discussed with: CRNA  Anesthesia Plan Comments:         Anesthesia Quick Evaluation

## 2019-02-28 NOTE — Op Note (Signed)
NAME:  Nariah Morgano Millirons                ACCOUNT NO.: [id number]      MEDICAL RECORD NO.: [id number]      FACILITY:  [facility]      PHYSICIAN:  Mauri Pole  DATE OF BIRTH:  09-22-1956     DATE OF PROCEDURE:  02/28/2019                                 OPERATIVE REPORT         PREOPERATIVE DIAGNOSIS: Right  hip osteoarthritis.      POSTOPERATIVE DIAGNOSIS:  Right hip osteoarthritis.      PROCEDURE:  Right total hip replacement through an anterior approach   utilizing DePuy THR system, component size 37mm pinnacle cup, a size 32+4 neutral   Altrex liner, a size 4 standard Tri Lock stem with a 32+5 delta ceramic   ball.      SURGEON:  Pietro Cassis. Alvan Dame, M.D.      ASSISTANT:  Griffith Citron, PA-C     ANESTHESIA:  Spinal.      SPECIMENS:  None.      COMPLICATIONS:  None.      BLOOD LOSS:  300 cc     DRAINS:  None.      INDICATION OF THE PROCEDURE:  Jamie Alexander is a 63 y.o. female who had   presented to office for evaluation of right hip pain.  Radiographs revealed   progressive degenerative changes with bone-on-bone   articulation of the  hip joint, including subchondral cystic changes and osteophytes.  The patient had painful limited range of   motion significantly affecting their overall quality of life and function.  The patient was failing to    respond to conservative measures including medications and/or injections and activity modification and at this point was ready   to proceed with more definitive measures.  Consent was obtained for   benefit of pain relief.  Specific risks of infection, DVT, component   failure, dislocation, neurovascular injury, and need for revision surgery were reviewed in the office as well discussion of   the anterior versus posterior approach were reviewed.     PROCEDURE IN DETAIL:  The patient was brought to operative theater.   Once adequate anesthesia, preoperative antibiotics, 2 gm of Ancef, 1 gm of  Tranexamic Acid, and 10 mg of Decadron were administered, the patient was positioned supine on the [employer] table.  Once the patient was safely positioned with adequate padding of boney prominences we predraped out the hip, and used fluoroscopy to confirm orientation of the pelvis.      The right hip was then prepped and draped from proximal iliac crest to   mid thigh with a shower curtain technique.      Time-out was performed identifying the patient, planned procedure, and the appropriate extremity.     An incision was then made 2 cm lateral to the   anterior superior iliac spine extending over the orientation of the   tensor fascia lata muscle and sharp dissection was carried down to the   fascia of the muscle.      The fascia was then incised.  The muscle belly was identified and swept   laterally and retractor placed along the superior neck.  Following   cauterization of the circumflex vessels and  removing some pericapsular   fat, a second cobra retractor was placed on the inferior neck.  A T-capsulotomy was made along the line of the   superior neck to the trochanteric fossa, then extended proximally and   distally.  Tag sutures were placed and the retractors were then placed   intracapsular.  We then identified the trochanteric fossa and   orientation of my neck cut and then made a neck osteotomy with the femur on traction.  The femoral   head was removed without difficulty or complication.  Traction was let   off and retractors were placed posterior and anterior around the   acetabulum.      The labrum and foveal tissue were debrided.  I began reaming with a 44 mm   reamer and reamed up to 49 mm reamer with good bony bed preparation and a 50 mm  cup was chosen.  The final 50 mm Pinnacle cup was then impacted under fluoroscopy to confirm the depth of penetration and orientation with respect to   Abduction and forward flexion.  A screw was placed into the ilium followed by the hole  eliminator.  The final   32+4 neutral Altrex liner was impacted with good visualized rim fit.  The cup was positioned anatomically within the acetabular portion of the pelvis.      At this point, the femur was rolled to 100 degrees.  Further capsule was   released off the inferior aspect of the femoral neck.  I then   released the superior capsule proximally.  With the leg in a neutral position the hook was placed laterally   along the femur under the vastus lateralis origin and elevated manually and then held in position using the hook attachment on the bed.  The leg was then extended and adducted with the leg rolled to 100   degrees of external rotation.  Retractors were placed along the medial calcar and posteriorly over the greater trochanter.  Once the proximal femur was fully   exposed, I used a box osteotome to set orientation.  I then began   broaching with the starting chili pepper broach and passed this by hand and then broached up to 4.  With the 4 broach in place I chose a standard vs high offset neck and did several trial reductions.  The offset was appropriate, leg lengths   appeared to be equal best matched with the +5 head ball trial confirmed radiographically.   Given these findings, I went ahead and dislocated the hip, repositioned all   retractors and positioned the right hip in the extended and abducted position.  The final 4 standard Tri Lock stem was   chosen and it was impacted down to the level of neck cut.  Based on this   and the trial reductions, a final 32+5 delta ceramic ball was chosen and   impacted onto a clean and dry trunnion, and the hip was reduced.  The   hip had been irrigated throughout the case again at this point.  I did   reapproximate the superior capsular leaflet to the anterior leaflet   using #1 Vicryl.  The fascia of the   tensor fascia lata muscle was then reapproximated using #1 Vicryl and #0 Stratafix sutures.  The   remaining wound was closed  with 2-0 Vicryl and running 4-0 Monocryl.   The hip was cleaned, dried, and dressed sterilely using Dermabond and   Aquacel dressing.  The patient was  then brought   to recovery room in stable condition tolerating the procedure well.    Griffith Citron, PA-C was present for the entirety of the case involved from   preoperative positioning, perioperative retractor management, general   facilitation of the case, as well as primary wound closure as assistant.            Pietro Cassis Alvan Dame, M.D.        02/28/2019 2:03 PM

## 2019-03-01 DIAGNOSIS — M1611 Unilateral primary osteoarthritis, right hip: Secondary | ICD-10-CM | POA: Diagnosis not present

## 2019-03-01 LAB — BASIC METABOLIC PANEL
Anion gap: 9 (ref 5–15)
BUN: 5 mg/dL — ABNORMAL LOW (ref 8–23)
CHLORIDE: 105 mmol/L (ref 98–111)
CO2: 24 mmol/L (ref 22–32)
Calcium: 9 mg/dL (ref 8.9–10.3)
Creatinine, Ser: 0.72 mg/dL (ref 0.44–1.00)
GFR calc Af Amer: >60 mL/min (ref >60)
GFR calc non Af Amer: >60 mL/min (ref >60)
Glucose, Bld: 152 mg/dL — ABNORMAL HIGH (ref 70–99)
POTASSIUM: 4.7 mmol/L (ref 3.5–5.1)
Sodium: 138 mmol/L (ref 135–145)

## 2019-03-01 LAB — CBC
HCT: 33.4 % — ABNORMAL LOW (ref 36.0–46.0)
HEMOGLOBIN: 10.6 g/dL — AB (ref 12.0–15.0)
MCH: 35.1 pg — ABNORMAL HIGH (ref 26.0–34.0)
MCHC: 31.7 g/dL (ref 30.0–36.0)
MCV: 110.6 fL — ABNORMAL HIGH (ref 80.0–100.0)
Platelets: 183 10*3/uL (ref 150–400)
RBC: 3.02 MIL/uL — ABNORMAL LOW (ref 3.87–5.11)
RDW: 12.6 % (ref 11.5–15.5)
WBC: 11.4 10*3/uL — ABNORMAL HIGH (ref 4.0–10.5)
nRBC: 0 % (ref 0.0–0.2)

## 2019-03-01 MED ORDER — ASPIRIN 81 MG PO CHEW: 81.0000 mg | CHEWABLE_TABLET | Freq: Two times a day (BID) | ORAL | 0 refills | Status: AC

## 2019-03-01 MED ORDER — HYDROCODONE-ACETAMINOPHEN 7.5-325 MG PO TABS: 1.0000 | ORAL_TABLET | ORAL | 0 refills | Status: DC | PRN

## 2019-03-01 MED ORDER — METHOCARBAMOL 500 MG PO TABS: 500.0000 mg | ORAL_TABLET | Freq: Four times a day (QID) | ORAL | 0 refills | Status: DC | PRN

## 2019-03-01 MED ORDER — FERROUS SULFATE 325 (65 FE) MG PO TABS: 325.0000 mg | ORAL_TABLET | Freq: Three times a day (TID) | ORAL | 3 refills | Status: DC

## 2019-03-01 NOTE — Progress Notes (Signed)
Physical Therapy Treatment Patient Details Name: Jamie Alexander MRN: [id number] DOB: 04-05-1956 Today's Date: 03/01/2019    History of Present Illness 63 yo female s/p R DA-THA on 02/28/19. PMH includes HLD, seasonal allergies.     PT Comments    Pt is progressing well with mobility, she ambulated 72' with RW, demonstrates understanding of HEP, and completed stair training. She is ready to DC home from PT standpoint.   Follow Up Recommendations  Follow surgeon's recommendation for DC plan and follow-up therapies;Supervision for mobility/OOB     Equipment Recommendations  None recommended by PT    Recommendations for Other Services       Precautions / Restrictions Precautions Precautions: Fall Restrictions Weight Bearing Restrictions: No Other Position/Activity Restrictions: WBAT     Mobility  Bed Mobility Overal bed mobility: Needs Assistance Bed Mobility: Supine to Sit     Supine to sit: HOB elevated;Supervision     General bed mobility comments: instructed pt to self assist RLE with LLE  Transfers Overall transfer level: Needs assistance Equipment used: Rolling walker (2 wheeled) Transfers: Sit to/from Stand Sit to Stand: Supervision         General transfer comment: VCs hand placement  Ambulation/Gait Ambulation/Gait assistance: Modified independent (Device/Increase time)(Nt- decreased sensation of bilateral feet noted in standing) Gait Distance (Feet): 80 Feet Assistive device: Rolling walker (2 wheeled) Gait Pattern/deviations: Step-to pattern;Decreased weight shift to right Gait velocity: decr   General Gait Details: VCs sequencing initially   Stairs Stairs: Yes Stairs assistance: Min assist Stair Management: No rails;Step to pattern;Backwards;With walker Number of Stairs: 3 General stair comments: VCs sequencing, husband present and demonstrates understanding of assisting with stabilizing RW   Wheelchair Mobility    Modified Rankin  (Stroke Patients Only)       Balance Overall balance assessment: Mild deficits observed, not formally tested                                          Cognition Arousal/Alertness: Awake/alert Behavior During Therapy: WFL for tasks assessed/performed Overall Cognitive Status: Within Functional Limits for tasks assessed                                        Exercises Total Joint Exercises Ankle Circles/Pumps: AROM;Both;10 reps;Supine Quad Sets: AROM;Both;5 reps;Supine Short Arc Quad: AROM;Right;10 reps;Supine Heel Slides: AAROM;Right;10 reps;Supine Hip ABduction/ADduction: AAROM;Right;10 reps;Supine Long Arc Quad: AROM;Right;10 reps;Seated    General Comments        Pertinent Vitals/Pain Pain Score: 4  Pain Location: R hip  Pain Descriptors / Indicators: Sore Pain Intervention(s): Limited activity within patient's tolerance;Monitored during session;Premedicated before session;Ice applied    Home Living                      Prior Function            PT Goals (current goals can now be found in the care plan section) Acute Rehab PT Goals Patient Stated Goal: play pickleball PT Goal Formulation: With patient/family Time For Goal Achievement: 03/07/19 Potential to Achieve Goals: Good Progress towards PT goals: Progressing toward goals    Frequency    7X/week      PT Plan      Co-evaluation  AM-PAC PT "6 Clicks" Mobility   Outcome Measure  Help needed turning from your back to your side while in a flat bed without using bedrails?: None Help needed moving from lying on your back to sitting on the side of a flat bed without using bedrails?: None Help needed moving to and from a bed to a chair (including a wheelchair)?: None Help needed standing up from a chair using your arms (e.g., wheelchair or bedside chair)?: None Help needed to walk in hospital room?: None Help needed climbing 3-5 steps with a  railing? : A Little 6 Click Score: 23    End of Session Equipment Utilized During Treatment: Gait belt Activity Tolerance: Patient tolerated treatment well Patient left: in chair;with chair alarm set;with call bell/phone within reach;with family/visitor present Nurse Communication: Mobility status PT Visit Diagnosis: Other abnormalities of gait and mobility (R26.89);Difficulty in walking, not elsewhere classified (R26.2)     Time: 6283-1517 PT Time Calculation (min) (ACUTE ONLY): 41 min  Charges:  $Gait Training: 8-22 mins $Therapeutic Exercise: 8-22 mins $Therapeutic Activity: 8-22 mins                     Blondell Reveal Kistler PT 03/01/2019  Acute Rehabilitation Services Pager [phone] Office [phone]

## 2019-03-01 NOTE — Progress Notes (Signed)
   Subjective: 1 Day Post-Op Procedure(s) (LRB): TOTAL HIP ARTHROPLASTY ANTERIOR APPROACH (Right) Patient reports pain as moderate.   Patient seen in rounds with Dr. Alvan Dame. Patient is well, and has had no acute complaints or problems other than pain in the right hip. No acute events overnight. Foley catheter removed, positive flatus.  We will continue therapy today.   Objective: Vital signs in last 24 hours: Temp:  [97.5 F (36.4 C)-98.8 F (37.1 C)] 98.8 F (37.1 C) (03/11 0520) Pulse Rate:  [69-100] 80 (03/11 0520) Resp:  [11-18] 18 (03/11 0520) BP: (98-124)/(68-83) 117/73 (03/11 0520) SpO2:  [95 %-100 %] 95 % (03/11 0520) Weight:  [67.7 kg] 67.7 kg (03/10 1640)  Intake/Output from previous day:  Intake/Output Summary (Last 24 hours) at 03/01/2019 0858 Last data filed at 03/01/2019 0700 Gross per 24 hour  Intake 4817.69 ml  Output 2250 ml  Net 2567.69 ml     Intake/Output this shift: No intake/output data recorded.  Labs: Recent Labs    03/01/19 0405  HGB 10.6*   Recent Labs    03/01/19 0405  WBC 11.4*  RBC 3.02*  HCT 33.4*  PLT 183   Recent Labs    03/01/19 0405  NA 138  K 4.7  CL 105  CO2 24  BUN 5*  CREATININE 0.72  GLUCOSE 152*  CALCIUM 9.0   No results for input(s): LABPT, INR in the last 72 hours.  Exam: General - Patient is Alert and Oriented Extremity - Neurologically intact Sensation intact distally Intact pulses distally Dorsiflexion/Plantar flexion intact Dressing - dressing C/D/I Motor Function - intact, moving foot and toes well on exam.   Past Medical History:  Diagnosis Date  . Arthritis   . Hyperlipidemia   . Seasonal allergies     Assessment/Plan: 1 Day Post-Op Procedure(s) (LRB): TOTAL HIP ARTHROPLASTY ANTERIOR APPROACH (Right) Active Problems:   S/P right THA   Status post total hip replacement, right  Estimated body mass index is 24.09 kg/m as calculated from the following:   Height as of this encounter: 5\' 6"   (1.676 m).   Weight as of this encounter: 67.7 kg. Advance diet Up with therapy D/C IV fluids  DVT Prophylaxis - Aspirin Weight bearing as tolerated. D/C O2 and pulse ox and try on room air. .  Plan is to go Home after hospital stay with HEP. She is doing well today. Plan for discharge today as long as she is meeting goals with therapy. Follow up in the office in 2 weeks.   Griffith Citron, PA-C Orthopedic Surgery 03/01/2019, 8:58 AM

## 2019-03-01 NOTE — Discharge Summary (Signed)
Physician Discharge Summary   Patient ID: Jamie Alexander MRN: [id number] DOB/AGE: February 07, 1956 63 y.o.  Admit date: 02/28/2019 Discharge date: 03/01/2019  Primary Diagnosis: Right hip osteoarthritis   Admission Diagnoses:  Past Medical History:  Diagnosis Date   Arthritis    Hyperlipidemia    Seasonal allergies    Discharge Diagnoses:   Active Problems:   S/P right THA   Status post total hip replacement, right  Estimated body mass index is 24.09 kg/m as calculated from the following:   Height as of this encounter: 5\' 6"  (1.676 m).   Weight as of this encounter: 67.7 kg.  Procedure:  Procedure(s) (LRB): TOTAL HIP ARTHROPLASTY ANTERIOR APPROACH (Right)   Consults: None  HPI: Jamie Alexander is a 63 y.o. female who had   presented to office for evaluation of right hip pain.  Radiographs revealed   progressive degenerative changes with bone-on-bone   articulation of the  hip joint, including subchondral cystic changes and osteophytes.  The patient had painful limited range of   motion significantly affecting their overall quality of life and function.  The patient was failing to    respond to conservative measures including medications and/or injections and activity modification and at this point was ready   to proceed with more definitive measures.  Consent was obtained for   benefit of pain relief.  Specific risks of infection, DVT, component   failure, dislocation, neurovascular injury, and need for revision surgery were reviewed in the office as well discussion of   the anterior versus posterior approach were reviewed.  Laboratory Data: Admission on 02/28/2019, Discharged on 03/01/2019  Component Date Value Ref Range Status   WBC 03/01/2019 11.4* 4.0 - 10.5 K/uL Final   RBC 03/01/2019 3.02* 3.87 - 5.11 MIL/uL Final   Hemoglobin 03/01/2019 10.6* 12.0 - 15.0 g/dL Final   HCT 03/01/2019 33.4* 36.0 - 46.0 % Final   MCV 03/01/2019 110.6* 80.0 - 100.0  fL Final   MCH 03/01/2019 35.1* 26.0 - 34.0 pg Final   MCHC 03/01/2019 31.7  30.0 - 36.0 g/dL Final   RDW 03/01/2019 12.6  11.5 - 15.5 % Final   Platelets 03/01/2019 183  150 - 400 K/uL Final   nRBC 03/01/2019 0.0  0.0 - 0.2 % Final   Performed at [facility], [place] [address] [place], [place] [address]   Sodium 03/01/2019 138  135 - 145 mmol/L Final   Potassium 03/01/2019 4.7  3.5 - 5.1 mmol/L Final   Chloride 03/01/2019 105  98 - 111 mmol/L Final   CO2 03/01/2019 24  22 - 32 mmol/L Final   Glucose, Bld 03/01/2019 152* 70 - 99 mg/dL Final   BUN 03/01/2019 5* 8 - 23 mg/dL Final   Creatinine, Ser 03/01/2019 0.72  0.44 - 1.00 mg/dL Final   Calcium 03/01/2019 9.0  8.9 - 10.3 mg/dL Final   GFR calc non Af Amer 03/01/2019 >60  >60 mL/min Final   GFR calc Af Amer 03/01/2019 >60  >60 mL/min Final   Anion gap 03/01/2019 9  5 - 15 Final   Performed at [facility], [place] [address] [place], [place] [address]  Hospital Outpatient Visit on 02/22/2019  Component Date Value Ref Range Status   ABO/RH(D) 02/22/2019 O POS   Final   Antibody Screen 02/22/2019 NEG   Final   Sample Expiration 02/22/2019 03/03/2019   Final   Extend sample reason 02/22/2019    Final  Value:NO TRANSFUSIONS OR PREGNANCY IN THE PAST 3 MONTHS Performed at [place] [address], [place], [place] [address]    MRSA, PCR 02/22/2019 NEGATIVE  NEGATIVE Final   Staphylococcus aureus 02/22/2019 NEGATIVE  NEGATIVE Final   Comment: (NOTE) The Xpert SA Assay (FDA approved for NASAL specimens in patients 38 years of age and older), is one component of a comprehensive surveillance program. It is not intended to diagnose infection nor to guide or monitor treatment. Performed at [facility], [place] [address], [place], [place] [address]    WBC 02/22/2019 10.1  4.0 - 10.5 K/uL Final   RBC 02/22/2019 3.66* 3.87 -  5.11 MIL/uL Final   Hemoglobin 02/22/2019 12.9  12.0 - 15.0 g/dL Final   HCT 02/22/2019 41.6  36.0 - 46.0 % Final   MCV 02/22/2019 113.7* 80.0 - 100.0 fL Final   MCH 02/22/2019 35.2* 26.0 - 34.0 pg Final   MCHC 02/22/2019 31.0  30.0 - 36.0 g/dL Final   RDW 02/22/2019 12.6  11.5 - 15.5 % Final   Platelets 02/22/2019 176  150 - 400 K/uL Final   nRBC 02/22/2019 0.0  0.0 - 0.2 % Final   Performed at [facility], [place] [address], [place], [place] [address]   ABO/RH(D) 02/22/2019    Final                   Value:O POS Performed at [facility], [place] [address], [place], [place] [address]      X-Rays:Dg Pelvis Portable  Result Date: 02/28/2019 CLINICAL DATA:  Postop right hip arthroplasty. EXAM: PORTABLE PELVIS 1-2 VIEWS COMPARISON:  Intraoperative radiographs, same date. FINDINGS: The right total hip arthroplasty components are well seated. No complicating features are identified. The left hip is normally located. The bony pelvis is intact. IMPRESSION: Well seated components of a total right hip arthroplasty. No complicating features are demonstrated. Electronically Signed   By: Marijo Sanes M.D.   On: 02/28/2019 15:06   Dg C-arm 1-60 Min-no Report  Result Date: 02/28/2019 CLINICAL DATA:  63 year old female for right hip replacement. Initial encounter. EXAM: OPERATIVE right HIP (WITH PELVIS IF PERFORMED) 4 VIEWS Fluoroscopic time: 20 seconds. TECHNIQUE: Fluoroscopic spot image(s) were submitted for interpretation post-operatively. COMPARISON:  None. FINDINGS: Four intraoperative C-arm views submitted for review after surgery. Right hip prosthesis partially placed. Femoral head component missing. Tip of prosthesis not imaged on frontal imaging. This can be assessed on follow-up. IMPRESSION: Post right hip replacement.  Please see above. Electronically Signed   By: Genia Del M.D.   On: 02/28/2019 14:48   Dg Hip Operative Unilat W Or W/o Pelvis  Right  Result Date: 02/28/2019 CLINICAL DATA:  63 year old female for right hip replacement. Initial encounter. EXAM: OPERATIVE right HIP (WITH PELVIS IF PERFORMED) 4 VIEWS Fluoroscopic time: 20 seconds. TECHNIQUE: Fluoroscopic spot image(s) were submitted for interpretation post-operatively. COMPARISON:  None. FINDINGS: Four intraoperative C-arm views submitted for review after surgery. Right hip prosthesis partially placed. Femoral head component missing. Tip of prosthesis not imaged on frontal imaging. This can be assessed on follow-up. IMPRESSION: Post right hip replacement.  Please see above. Electronically Signed   By: Genia Del M.D.   On: 02/28/2019 14:48    EKG:No orders found for this or any previous visit.   Hospital Course: Jamie Alexander is a 63 y.o. who was admitted to [facility]. They were brought to the operating room on 02/28/2019 and  underwent Procedure(s): TOTAL HIP ARTHROPLASTY ANTERIOR APPROACH.  Patient tolerated the procedure well and was later transferred to the recovery room and then to the orthopaedic floor for postoperative care. They were given PO and IV analgesics for pain control following their surgery. They were given 24 hours of postoperative antibiotics of  Anti-infectives (From admission, onward)   Start     Dose/Rate Route Frequency Ordered Stop   02/28/19 1900  ceFAZolin (ANCEF) IVPB 2g/100 mL premix     2 g 200 mL/hr over 30 Minutes Intravenous Every 6 hours 02/28/19 1602 03/01/19 0207   02/28/19 1015  ceFAZolin (ANCEF) IVPB 2g/100 mL premix     2 g 200 mL/hr over 30 Minutes Intravenous On call to O.R. 02/28/19 1007 02/28/19 1316     and started on DVT prophylaxis in the form of Aspirin.   PT and OT were ordered for total joint protocol. Discharge planning consulted to help with postop disposition and equipment needs.  Patient had a good night on the evening of surgery. They started to get up OOB with therapy on POD #0. Pt was seen during  rounds and was ready to go home pending progress with therapy.  She worked with therapy on POD #1 and was meeting her goals. Pt was discharged to home later that day in stable condition.  Diet: Regular diet Activity: WBAT Follow-up: in 2 weeks Disposition: Home Discharged Condition: good   Discharge Instructions    Call MD / Call 911   Complete by:  As directed    If you experience chest pain or shortness of breath, CALL 911 and be transported to the hospital emergency room.  If you develope a fever above 101 F, pus (white drainage) or increased drainage or redness at the wound, or calf pain, call your surgeon's office.   Change dressing   Complete by:  As directed    Maintain surgical dressing until follow up in the clinic. If the edges start to pull up, may reinforce with tape. If the dressing is no longer working, may remove and cover with gauze and tape, but must keep the area dry and clean.  Call with any questions or concerns.   Change dressing   Complete by:  As directed    Maintain surgical dressing until follow up in the clinic. If the edges start to pull up, may reinforce with tape. If the dressing is no longer working, may remove and cover with gauze and tape, but must keep the area dry and clean.  Call with any questions or concerns.   Constipation Prevention   Complete by:  As directed    Drink plenty of fluids.  Prune juice may be helpful.  You may use a stool softener, such as Colace (over the counter) 100 mg twice a day.  Use MiraLax (over the counter) for constipation as needed.   Diet - low sodium heart healthy   Complete by:  As directed    Discharge instructions   Complete by:  As directed    Maintain surgical dressing until follow up in the clinic. If the edges start to pull up, may reinforce with tape. If the dressing is no longer working, may remove and cover with gauze and tape, but must keep the area dry and clean.  Follow up in 2 weeks at [facility].  Call with any questions or concerns.   Increase activity slowly as tolerated   Complete by:  As directed    Weight bearing as  tolerated with assist device (walker, cane, etc) as directed, use it as long as suggested by your surgeon or therapist, typically at least 4-6 weeks.   TED hose   Complete by:  As directed    Use stockings (TED hose) for 2 weeks on both leg(s).  You may remove them at night for sleeping.   TED hose   Complete by:  As directed    Use stockings (TED hose) for 2 weeks on both leg(s).  You may remove them at night for sleeping.     Allergies as of 03/01/2019   No Known Allergies     Medication List    STOP taking these medications   acetaminophen 500 MG tablet Commonly known as:  TYLENOL   aspirin EC 81 MG tablet Replaced by:  aspirin 81 MG chewable tablet   diclofenac 75 MG EC tablet Commonly known as:  VOLTAREN     TAKE these medications   aspirin 81 MG chewable tablet Chew 1 tablet (81 mg total) by mouth 2 (two) times daily for 28 days. Then resume normal dose. Replaces:  aspirin EC 81 MG tablet   CoQ-10 100 MG Caps Take 100 mg by mouth daily.   ferrous sulfate 325 (65 FE) MG tablet Take 1 tablet (325 mg total) by mouth 3 (three) times daily after meals.   Fish Oil 1200 MG Caps Take 1,200 mg by mouth daily.   HYDROcodone-acetaminophen 7.5-325 MG tablet Commonly known as:  NORCO Take 1-2 tablets by mouth every 4 (four) hours as needed for severe pain (pain score 7-10).   loratadine 10 MG tablet Commonly known as:  CLARITIN Take 10 mg by mouth daily.   methocarbamol 500 MG tablet Commonly known as:  ROBAXIN Take 1 tablet (500 mg total) by mouth every 6 (six) hours as needed for muscle spasms.   multivitamin with minerals Tabs tablet Take 1 tablet by mouth daily.   psyllium 0.52 g capsule Commonly known as:  REGULOID Take 0.52 g by mouth daily.   rosuvastatin 5 MG tablet Commonly known as:  CRESTOR Take 5 mg by mouth every other  day.   Vitamin D3 125 MCG (5000 UT) Caps Take 5,000 Units by mouth daily.            Durable Medical Equipment  (From admission, onward)         Start     Ordered   02/28/19 1431  DME Walker rolling  Once    Question:  Patient needs a walker to treat with the following condition  Answer:  S/P total hip arthroplasty   02/28/19 1430   02/28/19 1431  DME 3 n 1  Once     02/28/19 1430           Discharge Care Instructions  (From admission, onward)         Start     Ordered   03/01/19 0000  Change dressing    Comments:  Maintain surgical dressing until follow up in the clinic. If the edges start to pull up, may reinforce with tape. If the dressing is no longer working, may remove and cover with gauze and tape, but must keep the area dry and clean.  Call with any questions or concerns.   03/01/19 0915   03/01/19 0000  Change dressing    Comments:  Maintain surgical dressing until follow up in the clinic. If the edges start to pull up, may reinforce with tape. If the dressing is no  longer working, may remove and cover with gauze and tape, but must keep the area dry and clean.  Call with any questions or concerns.   03/01/19 0915         Follow-up Information    Paralee Cancel, MD. Schedule an appointment as soon as possible for a visit in 2 week(s).   Specialty:  Orthopedic Surgery Contact information: [address] [place] [address] [phone]           Signed: Griffith Citron, PA-C Orthopedic Surgery 03/01/2019, 3:46 PM

## 2019-03-01 NOTE — TOC Transition Note (Signed)
Transition of Care [facility]) - CM/SW Discharge Note   Patient Details  Name: Jamie Alexander MRN: [id number] Date of Birth: [birth year]  [facility] CM/SW Contact:  Leeroy Cha, RN Phone Number: 03/01/2019, 10:41 AM   Clinical Narrative:    Discharge to home with HEP Has needed equip   Final next level of care: Home/Self Care Barriers to Discharge: No Barriers Identified   Patient Goals and CMS Choice Patient states their goals for this hospitalization and ongoing recovery are:: to walk again without pain CMS Medicare.gov Compare Post Acute Care list provided to:: Patient    Discharge Placement  home with spouse                     Discharge Plan and Services Discharge Planning Services: CM Consult Post Acute Care Choice: Durable Medical Equipment          DME Arranged: 3-N-1, Walker rolling DME Agency: [place]       Social Determinants of Health (SDOH) Interventions     Readmission Risk Interventions No flowsheet data found.

## 2019-03-02 ENCOUNTER — Encounter (HOSPITAL_COMMUNITY): Payer: Self-pay | Admitting: Orthopedic Surgery

## 2019-04-17 DIAGNOSIS — Z471 Aftercare following joint replacement surgery: Secondary | ICD-10-CM | POA: Diagnosis not present

## 2019-04-17 DIAGNOSIS — Z96641 Presence of right artificial hip joint: Secondary | ICD-10-CM | POA: Diagnosis not present

## 2019-12-27 ENCOUNTER — Ambulatory Visit: Payer: Medicare Other | Attending: Internal Medicine

## 2019-12-27 ENCOUNTER — Other Ambulatory Visit: Payer: Self-pay

## 2019-12-27 DIAGNOSIS — Z20822 Contact with and (suspected) exposure to covid-19: Secondary | ICD-10-CM | POA: Diagnosis not present

## 2019-12-28 LAB — NOVEL CORONAVIRUS, NAA: SARS-CoV-2, NAA: NOT DETECTED

## 2020-01-25 DIAGNOSIS — Z23 Encounter for immunization: Secondary | ICD-10-CM | POA: Diagnosis not present

## 2020-02-06 DIAGNOSIS — Z1339 Encounter for screening examination for other mental health and behavioral disorders: Secondary | ICD-10-CM | POA: Diagnosis not present

## 2020-02-06 DIAGNOSIS — Z1331 Encounter for screening for depression: Secondary | ICD-10-CM | POA: Diagnosis not present

## 2020-02-06 DIAGNOSIS — R5383 Other fatigue: Secondary | ICD-10-CM | POA: Diagnosis not present

## 2020-02-06 DIAGNOSIS — Z0000 Encounter for general adult medical examination without abnormal findings: Secondary | ICD-10-CM | POA: Diagnosis not present

## 2020-02-06 DIAGNOSIS — R945 Abnormal results of liver function studies: Secondary | ICD-10-CM | POA: Diagnosis not present

## 2020-02-06 DIAGNOSIS — Z6828 Body mass index (BMI) 28.0-28.9, adult: Secondary | ICD-10-CM | POA: Diagnosis not present

## 2020-02-06 DIAGNOSIS — E039 Hypothyroidism, unspecified: Secondary | ICD-10-CM | POA: Diagnosis not present

## 2020-02-06 DIAGNOSIS — E7800 Pure hypercholesterolemia, unspecified: Secondary | ICD-10-CM | POA: Diagnosis not present

## 2020-02-06 DIAGNOSIS — I10 Essential (primary) hypertension: Secondary | ICD-10-CM | POA: Diagnosis not present

## 2020-02-06 DIAGNOSIS — Z7189 Other specified counseling: Secondary | ICD-10-CM | POA: Diagnosis not present

## 2020-02-06 DIAGNOSIS — Z299 Encounter for prophylactic measures, unspecified: Secondary | ICD-10-CM | POA: Diagnosis not present

## 2020-02-09 DIAGNOSIS — Z1231 Encounter for screening mammogram for malignant neoplasm of breast: Secondary | ICD-10-CM | POA: Diagnosis not present

## 2020-02-27 DIAGNOSIS — H40013 Open angle with borderline findings, low risk, bilateral: Secondary | ICD-10-CM | POA: Diagnosis not present

## 2020-02-28 DIAGNOSIS — Z471 Aftercare following joint replacement surgery: Secondary | ICD-10-CM | POA: Diagnosis not present

## 2020-02-28 DIAGNOSIS — Z96641 Presence of right artificial hip joint: Secondary | ICD-10-CM | POA: Diagnosis not present

## 2020-03-14 ENCOUNTER — Encounter (INDEPENDENT_AMBULATORY_CARE_PROVIDER_SITE_OTHER): Payer: Self-pay | Admitting: *Deleted

## 2020-03-26 DIAGNOSIS — L720 Epidermal cyst: Secondary | ICD-10-CM | POA: Diagnosis not present

## 2020-03-26 DIAGNOSIS — L821 Other seborrheic keratosis: Secondary | ICD-10-CM | POA: Diagnosis not present

## 2020-03-26 DIAGNOSIS — I781 Nevus, non-neoplastic: Secondary | ICD-10-CM | POA: Diagnosis not present

## 2020-03-26 DIAGNOSIS — D1801 Hemangioma of skin and subcutaneous tissue: Secondary | ICD-10-CM | POA: Diagnosis not present

## 2020-03-26 DIAGNOSIS — D485 Neoplasm of uncertain behavior of skin: Secondary | ICD-10-CM | POA: Diagnosis not present

## 2020-03-26 DIAGNOSIS — D225 Melanocytic nevi of trunk: Secondary | ICD-10-CM | POA: Diagnosis not present

## 2020-04-08 DIAGNOSIS — Z23 Encounter for immunization: Secondary | ICD-10-CM | POA: Diagnosis not present

## 2020-04-17 DIAGNOSIS — Z23 Encounter for immunization: Secondary | ICD-10-CM | POA: Diagnosis not present

## 2020-04-18 ENCOUNTER — Other Ambulatory Visit (INDEPENDENT_AMBULATORY_CARE_PROVIDER_SITE_OTHER): Payer: Self-pay | Admitting: *Deleted

## 2020-04-18 DIAGNOSIS — R1319 Other dysphagia: Secondary | ICD-10-CM

## 2020-04-18 DIAGNOSIS — Z1211 Encounter for screening for malignant neoplasm of colon: Secondary | ICD-10-CM

## 2020-04-18 DIAGNOSIS — R112 Nausea with vomiting, unspecified: Secondary | ICD-10-CM

## 2020-04-23 ENCOUNTER — Encounter (INDEPENDENT_AMBULATORY_CARE_PROVIDER_SITE_OTHER): Payer: Self-pay | Admitting: *Deleted

## 2020-04-26 ENCOUNTER — Telehealth (INDEPENDENT_AMBULATORY_CARE_PROVIDER_SITE_OTHER): Payer: Self-pay | Admitting: *Deleted

## 2020-04-26 MED ORDER — PLENVU 140 G PO SOLR
1.0000 | Freq: Once | ORAL | 0 refills | Status: AC
Start: 2020-04-26 — End: 2020-04-26

## 2020-04-26 NOTE — Telephone Encounter (Signed)
Patient needs Plenvu (copay card) ° °

## 2020-05-01 ENCOUNTER — Other Ambulatory Visit: Payer: Self-pay

## 2020-05-01 ENCOUNTER — Ambulatory Visit (INDEPENDENT_AMBULATORY_CARE_PROVIDER_SITE_OTHER): Payer: Self-pay

## 2020-05-28 ENCOUNTER — Other Ambulatory Visit (HOSPITAL_COMMUNITY)
Admission: RE | Admit: 2020-05-28 | Discharge: 2020-05-28 | Disposition: A | Discharge status: Home / Self Care | Payer: Medicare Other | Source: Ambulatory Visit | Attending: Internal Medicine | Admitting: Internal Medicine

## 2020-05-28 ENCOUNTER — Other Ambulatory Visit: Payer: Self-pay

## 2020-05-28 DIAGNOSIS — Z01812 Encounter for preprocedural laboratory examination: Secondary | ICD-10-CM | POA: Insufficient documentation

## 2020-05-28 DIAGNOSIS — Z20822 Contact with and (suspected) exposure to covid-19: Secondary | ICD-10-CM | POA: Insufficient documentation

## 2020-05-28 LAB — SARS CORONAVIRUS 2 (TAT 6-24 HRS): SARS Coronavirus 2: NEGATIVE

## 2020-05-30 ENCOUNTER — Encounter (HOSPITAL_COMMUNITY): Payer: Self-pay | Admitting: Internal Medicine

## 2020-05-30 ENCOUNTER — Ambulatory Visit (HOSPITAL_COMMUNITY)
Admission: RE | Admit: 2020-05-30 | Discharge: 2020-05-30 | Disposition: A | Discharge status: Home / Self Care | Payer: Medicare Other | Attending: Internal Medicine | Admitting: Internal Medicine

## 2020-05-30 ENCOUNTER — Other Ambulatory Visit: Payer: Self-pay

## 2020-05-30 ENCOUNTER — Encounter (HOSPITAL_COMMUNITY)
Admission: RE | Disposition: A | Discharge status: Home / Self Care | Payer: Self-pay | Source: Home / Self Care | Attending: Internal Medicine

## 2020-05-30 DIAGNOSIS — K2100 Gastro-esophageal reflux disease with esophagitis, without bleeding: Secondary | ICD-10-CM | POA: Diagnosis not present

## 2020-05-30 DIAGNOSIS — K319 Disease of stomach and duodenum, unspecified: Secondary | ICD-10-CM | POA: Diagnosis not present

## 2020-05-30 DIAGNOSIS — D123 Benign neoplasm of transverse colon: Secondary | ICD-10-CM | POA: Diagnosis not present

## 2020-05-30 DIAGNOSIS — Z7982 Long term (current) use of aspirin: Secondary | ICD-10-CM | POA: Insufficient documentation

## 2020-05-30 DIAGNOSIS — Z96641 Presence of right artificial hip joint: Secondary | ICD-10-CM | POA: Diagnosis not present

## 2020-05-30 DIAGNOSIS — Z1211 Encounter for screening for malignant neoplasm of colon: Secondary | ICD-10-CM | POA: Diagnosis not present

## 2020-05-30 DIAGNOSIS — M1990 Unspecified osteoarthritis, unspecified site: Secondary | ICD-10-CM | POA: Diagnosis not present

## 2020-05-30 DIAGNOSIS — R112 Nausea with vomiting, unspecified: Secondary | ICD-10-CM

## 2020-05-30 DIAGNOSIS — R1319 Other dysphagia: Secondary | ICD-10-CM | POA: Diagnosis not present

## 2020-05-30 DIAGNOSIS — E785 Hyperlipidemia, unspecified: Secondary | ICD-10-CM | POA: Diagnosis not present

## 2020-05-30 DIAGNOSIS — R1314 Dysphagia, pharyngoesophageal phase: Secondary | ICD-10-CM | POA: Diagnosis not present

## 2020-05-30 DIAGNOSIS — K2970 Gastritis, unspecified, without bleeding: Secondary | ICD-10-CM | POA: Insufficient documentation

## 2020-05-30 DIAGNOSIS — Z791 Long term (current) use of non-steroidal anti-inflammatories (NSAID): Secondary | ICD-10-CM | POA: Insufficient documentation

## 2020-05-30 DIAGNOSIS — K3189 Other diseases of stomach and duodenum: Secondary | ICD-10-CM | POA: Diagnosis not present

## 2020-05-30 HISTORY — PX: ESOPHAGOGASTRODUODENOSCOPY: SHX5428

## 2020-05-30 HISTORY — PX: BIOPSY: SHX5522

## 2020-05-30 HISTORY — PX: COLONOSCOPY: SHX5424

## 2020-05-30 HISTORY — PX: POLYPECTOMY: SHX5525

## 2020-05-30 SURGERY — COLONOSCOPY
Anesthesia: Moderate Sedation

## 2020-05-30 MED ORDER — STERILE WATER FOR IRRIGATION IR SOLN
Status: DC | PRN
  Administered 2020-05-30: 1.5 mL

## 2020-05-30 MED ORDER — MIDAZOLAM HCL 5 MG/5ML IJ SOLN
INTRAMUSCULAR | Status: DC | PRN
  Administered 2020-05-30 (×2): 1 mg via INTRAVENOUS
  Administered 2020-05-30 (×2): 2 mg via INTRAVENOUS
  Administered 2020-05-30: 1 mg via INTRAVENOUS
  Administered 2020-05-30: 2 mg via INTRAVENOUS

## 2020-05-30 MED ORDER — MEPERIDINE HCL 50 MG/ML IJ SOLN
INTRAMUSCULAR | Status: AC
  Filled 2020-05-30: qty 1

## 2020-05-30 MED ORDER — MIDAZOLAM HCL 5 MG/5ML IJ SOLN
INTRAMUSCULAR | Status: AC
  Filled 2020-05-30: qty 10

## 2020-05-30 MED ORDER — PANTOPRAZOLE SODIUM 40 MG PO TBEC: 40.0000 mg | DELAYED_RELEASE_TABLET | Freq: Every day | ORAL | 5 refills | Status: DC

## 2020-05-30 MED ORDER — SODIUM CHLORIDE 0.9 % IV SOLN: INTRAVENOUS | Status: DC

## 2020-05-30 MED ORDER — MEPERIDINE HCL 50 MG/ML IJ SOLN
INTRAMUSCULAR | Status: DC | PRN
  Administered 2020-05-30 (×3): 25 mg via INTRAVENOUS

## 2020-05-30 MED ORDER — LIDOCAINE VISCOUS HCL 2 % MT SOLN
OROMUCOSAL | Status: DC | PRN
  Administered 2020-05-30: 1 via OROMUCOSAL

## 2020-05-30 MED ORDER — LIDOCAINE VISCOUS HCL 2 % MT SOLN
OROMUCOSAL | Status: AC
  Filled 2020-05-30: qty 15

## 2020-05-30 NOTE — Discharge Instructions (Signed)
No aspirin or NSAIDs for 24 hours. Resume other medications as before. Pantoprazole 40 mg by mouth 30 minutes before breakfast daily. Resume usual diet. No driving for 24 hours. Keep symptom diary as to the spells that you have with nausea and heaving office visit in 3 months.   Colonoscopy, Adult, Care After This sheet gives you information about how to care for yourself after your procedure. Your health care provider may also give you more specific instructions. If you have problems or questions, contact your health care provider. Dr Laural Golden: [phone] What can I expect after the procedure? After the procedure, it is common to have:  A small amount of blood in your stool for 24 hours after the procedure.  Some gas.  Mild cramping or bloating of your abdomen. Follow these instructions at home: Eating and drinking   Drink enough fluid to keep your urine pale yellow.  Resume your normal diet as instructed by your health care provider. Avoid heavy or fried foods that are hard to digest. Activity  Rest as told by your health care provider.  Managing cramping and bloating   Try walking around when you have cramps or feel bloated.  General instructions  For the first 24 hours after the procedure: ? Do not drive or use machinery. ? Do not sign important documents. ? Do not drink alcohol.  Take over-the-counter and prescription medicines only as told by your health care provider.  Keep all follow-up visits as told by your health care provider. This is important. Contact a health care provider if:  You have blood in your stool 2-3 days after the procedure. Get help right away if you have:  More than a small spotting of blood in your stool.  Large blood clots in your stool.  Swelling of your abdomen.  Nausea or vomiting.  A fever.  Increasing pain in your abdomen that is not relieved with medicine. Summary  After the procedure, it is common to have a small  amount of blood in your stool. You may also have mild cramping and bloating of your abdomen.  For the first 24 hours after the procedure, do not drive or use machinery, sign important documents, or drink alcohol.  Get help right away if you have a lot of blood in your stool, nausea or vomiting, a fever, or increased pain in your abdomen. This information is not intended to replace advice given to you by your health care provider. Make sure you discuss any questions you have with your health care provider. Document Revised: 07/03/2019 Document Reviewed: 07/03/2019 Elsevier Patient Education  [place].     Upper Endoscopy, Adult, Care After This sheet gives you information about how to care for yourself after your procedure. Your health care provider may also give you more specific instructions. If you have problems or questions, contact your health care provider. What can I expect after the procedure? After the procedure, it is common to have:  A sore throat.  Mild stomach pain or discomfort.  Bloating.  Nausea. Follow these instructions at home:  Take over-the-counter and prescription medicines only as told by your health care provider.  Do not drive for 24 hours if you were given a sedative during your procedure.  Keep all follow-up visits as told by your health care provider. This is important. Contact a health care provider if you have:  A sore throat that lasts longer than one day.  Trouble swallowing. Get help right away if:  You  vomit blood or your vomit looks like coffee grounds.  You have: ? A fever. ? Bloody, black, or tarry stools. ? A severe sore throat or you cannot swallow. ? Difficulty breathing. ? Severe pain in your chest or abdomen. Summary  After the procedure, it is common to have a sore throat, mild stomach discomfort, bloating, and nausea.  Do not drive for 24 hours if you were given a sedative during the procedure.  Follow  instructions from your health care provider about what to eat or drink after your procedure.  Return to your normal activities as told by your health care provider. This information is not intended to replace advice given to you by your health care provider. Make sure you discuss any questions you have with your health care provider. Document Revised: 05/31/2018 Document Reviewed: 05/09/2018 Elsevier Patient Education  [place].

## 2020-05-30 NOTE — Op Note (Signed)
[facility] Patient Name: Jamie Alexander Procedure Date: 05/30/2020 7:14 AM MRN: [id number] Date of Birth: 06-Aug-1956 Attending MD: Hildred Laser , MD CSN: [id number] Age: 64 Admit Type: Outpatient Procedure:                Colonoscopy Indications:              Screening for colorectal malignant neoplasm Providers:                Hildred Laser, MD, Charlsie Quest. Theda Sers RN, RN, Rosina Lowenstein, RN Referring MD:             Fuller Canada. Manuella Ghazi, MD Medicines:                None Complications:            No immediate complications. Estimated Blood Loss:     Estimated blood loss was minimal. Procedure:                Pre-Anesthesia Assessment:                           - Prior to the procedure, a History and Physical                            was performed, and patient medications and                            allergies were reviewed. The patient's tolerance of                            previous anesthesia was also reviewed. The risks                            and benefits of the procedure and the sedation                            options and risks were discussed with the patient.                            All questions were answered, and informed consent                            was obtained. ASA Grade Assessment: I - A normal,                            healthy patient. After reviewing the risks and                            benefits, the patient was deemed in satisfactory                            condition to undergo the procedure.  After obtaining informed consent, the colonoscope                            was passed under direct vision. Throughout the                            procedure, the patient's blood pressure, pulse, and                            oxygen saturations were monitored continuously. The                            [id number]-H190DL ([id number] scope was introduced through                            the anus and  advanced to the the cecum, identified                            by appendiceal orifice and ileocecal valve. The                            colonoscopy was performed without difficulty. The                            patient tolerated the procedure well. The quality                            of the bowel preparation was excellent. The                            ileocecal valve, appendiceal orifice, and rectum                            were photographed. Scope In: 8:07:39 AM Scope Out: 8:25:11 AM Scope Withdrawal Time: 0 hours 12 minutes 23 seconds  Total Procedure Duration: 0 hours 17 minutes 32 seconds  Findings:      The perianal and digital rectal examinations were normal.      A small polyp was found in the proximal transverse colon. The polyp was       sessile. The polyp was removed with a cold snare. Resection was       complete, but the polyp tissue was not retrieved.      The exam was otherwise normal throughout the examined colon.      The retroflexed view of the distal rectum and anal verge was normal and       showed no anal or rectal abnormalities. Impression:               - One small polyp in the proximal transverse colon,                            removed with a cold snare. Complete resection.                            Polyp tissue not retrieved.  comment: polyp appeared to be an adenoma. Moderate Sedation:      Moderate (conscious) sedation was administered by the endoscopy nurse       and supervised by the endoscopist. The following parameters were       monitored: oxygen saturation, heart rate, blood pressure, CO2       capnography and response to care. Total physician intraservice time was       18 minutes. Recommendation:           - Patient has a contact number available for                            emergencies. The signs and symptoms of potential                            delayed complications were discussed with the                             patient. Return to normal activities tomorrow.                            Written discharge instructions were provided to the                            patient.                           - Resume previous diet today.                           - Continue present medications.                           - No aspirin, ibuprofen, naproxen, or other                            non-steroidal anti-inflammatory drugs for 1 day                            after biopsy.                           - Repeat colonoscopy in 7 years. Procedure Code(s):        --- Professional ---                           [phone], Colonoscopy, flexible; with removal of                            tumor(s), polyp(s), or other lesion(s) by snare                            technique                           G0500, Moderate sedation services provided by the  same physician or other qualified health care                            professional performing a gastrointestinal                            endoscopic service that sedation supports,                            requiring the presence of an independent trained                            observer to assist in the monitoring of the                            patient's level of consciousness and physiological                            status; initial 15 minutes of intra-service time;                            patient age 91 years or older (additional time may                            be reported with [phone], as appropriate) Diagnosis Code(s):        --- Professional ---                           Z12.11, Encounter for screening for malignant                            neoplasm of colon                           K63.5, Polyp of colon CPT copyright 2019 American Medical Association. All rights reserved. The codes documented in this report are preliminary and upon coder review may  be revised to meet current compliance requirements. Hildred Laser, MD Hildred Laser, MD 05/30/2020 8:42:31 AM This report has been signed electronically. Number of Addenda: 0

## 2020-05-30 NOTE — H&P (Signed)
Jamie Alexander is an 64 y.o. female.   Chief Complaint: Patient is here for esophagogastroduodenoscopy with esophageal dilation and colonoscopy. HPI: She is a 64 year old Caucasian female who has a 5-year history of intermittent dysphagia. She points to suprasternal area site of bolus obstruction. She denies heartburn. She also complains of intermittent spells where she gets nauseated starts evening and breaks out in sweat. She had an episode 2 days ago when she thought she had phlegm in her throat and was not able to cough up anything. These episodes occur about once a month. She denies melena or rectal bleeding change in bowel habits. Last colonoscopy was normal about 10 years ago. Family history is negative for CRC.  Past Medical History:  Diagnosis Date  . Arthritis   . Hyperlipidemia   . Seasonal allergies     Past Surgical History:  Procedure Laterality Date  . BUNIONECTOMY Left   . COLONOSCOPY    . TOTAL HIP ARTHROPLASTY Right 02/28/2019   Procedure: TOTAL HIP ARTHROPLASTY ANTERIOR APPROACH;  Surgeon: Paralee Cancel, MD;  Location: [facility];  Service: Orthopedics;  Laterality: Right;    History reviewed. No pertinent family history. Social History:  reports that she has never smoked. She has never used smokeless tobacco. She reports current alcohol use of about 14.0 standard drinks of alcohol per week. She reports that she does not use drugs.  Allergies: No Known Allergies  Medications Prior to Admission  Medication Sig Dispense Refill  . acyclovir (ZOVIRAX) 800 MG tablet Take 800 mg by mouth daily as needed (cold sores).    Marland Kitchen amoxicillin (AMOXIL) 500 MG capsule Take 2,000 mg by mouth See admin instructions. Take 2000 mg 1 hour prior to dental work    . aspirin EC 81 MG tablet Take 81 mg by mouth daily.    . Cholecalciferol (VITAMIN D3) 125 MCG (5000 UT) CAPS Take 5,000 Units by mouth daily.    Marland Kitchen ibuprofen (ADVIL) 200 MG tablet Take 400 mg by mouth every 6 (six) hours as  needed for moderate pain.    Marland Kitchen ibuprofen (ADVIL) 800 MG tablet Take 800 mg by mouth at bedtime as needed for moderate pain.    Marland Kitchen loratadine (CLARITIN) 10 MG tablet Take 10 mg by mouth daily.    . Multiple Vitamin (MULTIVITAMIN WITH MINERALS) TABS tablet Take 1 tablet by mouth daily.    Marland Kitchen Neomy-Bacit-Polymyx-Pramoxine (NEOSPORIN + PAIN/ITCH/SCAR) 1 % OINT Apply 1 application topically daily as needed (itching).    . psyllium (REGULOID) 0.52 g capsule Take 0.52 g by mouth daily.    . ferrous sulfate 325 (65 FE) MG tablet Take 1 tablet (325 mg total) by mouth 3 (three) times daily after meals. (Patient not taking: Reported on 05/21/2020)  3    Results for orders placed or performed during the hospital encounter of 05/28/20 (from the past 48 hour(s))  SARS CORONAVIRUS 2 (TAT 6-24 HRS) Nasopharyngeal Nasopharyngeal Swab     Status: None   Collection Time: 05/28/20  8:11 AM   Specimen: Nasopharyngeal Swab  Result Value Ref Range   SARS Coronavirus 2 NEGATIVE NEGATIVE    Comment: (NOTE) SARS-CoV-2 target nucleic acids are NOT DETECTED. The SARS-CoV-2 RNA is generally detectable in upper and lower respiratory specimens during the acute phase of infection. Negative results do not preclude SARS-CoV-2 infection, do not rule out co-infections with other pathogens, and should not be used as the sole basis for treatment or other patient management decisions. Negative results must be combined  with clinical observations, patient history, and epidemiological information. The expected result is Negative. Fact Sheet for Patients: SugarRoll.be Fact Sheet for Healthcare Providers: https://www.woods-mathews.com/ This test is not yet approved or cleared by the [place] FDA and  has been authorized for detection and/or diagnosis of SARS-CoV-2 by FDA under an Emergency Use Authorization (EUA). This EUA will remain  in effect (meaning this test can be used) for the  duration of the COVID-19 declaration under Section 56 4(b)(1) of the Act, 21 U.S.C. section 360bbb-3(b)(1), unless the authorization is terminated or revoked sooner. Performed at [facility], [place] [address] [place], [place] [address]    No results found.  Review of Systems  Blood pressure 126/77, pulse 76, temperature 98.3 F (36.8 C), temperature source Oral, resp. rate 17, height 5\' 6"  (1.676 m), weight 70.3 kg, SpO2 100 %. Physical Exam  Vitals reviewed. HENT:  Mouth/Throat: Mucous membranes are moist. Oropharynx is clear.  Cardiovascular: Normal rate, regular rhythm and normal heart sounds.  No murmur heard. Respiratory: Effort normal and breath sounds normal.  GI: Soft. She exhibits no distension. There is no abdominal tenderness.  Musculoskeletal:     Cervical back: Neck supple.     Right lower leg: No edema.     Left lower leg: No edema.  Lymphadenopathy:    She has no cervical adenopathy.  Neurological: She is alert.  Skin: Skin is warm and dry.  Psychiatric: Mood normal.     Assessment/Plan Esophageal dysphagia. Intermittent spells of nausea and heaving. Esophagogastroduodenoscopy with esophageal dilation and average risk screening colonoscopy.  Hildred Laser, MD 05/30/2020, 7:31 AM

## 2020-05-30 NOTE — Op Note (Signed)
[facility] Patient Name: Jamie Alexander Procedure Date: 05/30/2020 7:18 AM MRN: [id number] Date of Birth: 1956/03/08 Attending MD: Hildred Laser , MD CSN: [id number] Age: 64 Admit Type: Outpatient Procedure:                Upper GI endoscopy Indications:              Esophageal dysphagia, Nausea Providers:                Hildred Laser, MD, Selena Lesser RN, RN, Rosina Lowenstein, RN Referring MD:             Fuller Canada Manuella Ghazi, MD Medicines:                Lidocaine spray, Meperidine 75 mg IV, Midazolam 10                            mg IV Complications:            No immediate complications. Estimated Blood Loss:     Estimated blood loss was minimal. Procedure:                Pre-Anesthesia Assessment:                           - Prior to the procedure, a History and Physical                            was performed, and patient medications and                            allergies were reviewed. The patient's tolerance of                            previous anesthesia was also reviewed. The risks                            and benefits of the procedure and the sedation                            options and risks were discussed with the patient.                            All questions were answered, and informed consent                            was obtained. Prior Anticoagulants: The patient has                            taken no previous anticoagulant or antiplatelet                            agents except for aspirin. ASA Grade Assessment: I                            -  A normal, healthy patient. After reviewing the                            risks and benefits, the patient was deemed in                            satisfactory condition to undergo the procedure.                           After obtaining informed consent, the endoscope was                            passed under direct vision. Throughout the                            procedure, the  patient's blood pressure, pulse, and                            oxygen saturations were monitored continuously. The                            GIF-H190 ([id number]) scope was introduced through the                            mouth, and advanced to the second part of duodenum.                            The upper GI endoscopy was accomplished without                            difficulty. The patient tolerated the procedure                            well. Scope In: 7:42:50 AM Scope Out: 8:01:27 AM Total Procedure Duration: 0 hours 18 minutes 37 seconds  Findings:      The hypopharynx was normal.      LA Grade A (one or more mucosal breaks less than 5 mm, not extending       between tops of 2 mucosal folds) esophagitis was found 42 cm from the       incisors.      No endoscopic abnormality was evident in the esophagus to explain the       patient's complaint of dysphagia. It was decided, however, to proceed       with dilation of the entire esophagus. The scope was withdrawn. Dilation       was performed with a Maloney dilator with mild resistance at 29 Fr. The       dilation site was examined following endoscope reinsertion and showed no       change and no bleeding, mucosal tear or perforation.      Localized mild inflammation characterized by congestion (edema),       erythema and granularity was found in the prepyloric region of the       stomach. Biopsies were taken with a cold forceps for histology. The       pathology specimen was  placed into Bottle Number 1.      The exam of the stomach was otherwise normal.      The duodenal bulb and second portion of the duodenum were normal. Impression:               - Normal hypopharynx.                           - LA Grade A reflux esophagitis.                           - No endoscopic esophageal abnormality to explain                            patient's dysphagia. Esophagus dilated. Dilated.                           - Gastritis. Biopsied.                            - Normal duodenal bulb and second portion of the                            duodenum. Moderate Sedation:      Moderate (conscious) sedation was administered by the endoscopy nurse       and supervised by the endoscopist. The following parameters were       monitored: oxygen saturation, heart rate, blood pressure, CO2       capnography and response to care. Total physician intraservice time was       23 minutes. Recommendation:           - Patient has a contact number available for                            emergencies. The signs and symptoms of potential                            delayed complications were discussed with the                            patient. Return to normal activities tomorrow.                            Written discharge instructions were provided to the                            patient.                           - Resume previous diet today.                           - Continue present medications.                           - No aspirin, ibuprofen, naproxen, or other  non-steroidal anti-inflammatory drugs for 1 day.                           - Await pathology results.                           - Use Protonix (pantoprazole) 40 mg PO daily. Procedure Code(s):        --- Professional ---                           [id number], Esophagogastroduodenoscopy, flexible,                            transoral; with biopsy, single or multiple                           43450, Dilation of esophagus, by unguided sound or                            bougie, single or multiple passes                           99153, Moderate sedation; each additional 15                            minutes intraservice time                           G0500, Moderate sedation services provided by the                            same physician or other qualified health care                            professional performing a gastrointestinal                             endoscopic service that sedation supports,                            requiring the presence of an independent trained                            observer to assist in the monitoring of the                            patient's level of consciousness and physiological                            status; initial 15 minutes of intra-service time;                            patient age 31 years or older (additional time 23  be reported with [phone], as appropriate) Diagnosis Code(s):        --- Professional ---                           K21.00, Gastro-esophageal reflux disease with                            esophagitis, without bleeding                           K29.70, Gastritis, unspecified, without bleeding                           R13.14, Dysphagia, pharyngoesophageal phase                           R11.0, Nausea CPT copyright 2019 American Medical Association. All rights reserved. The codes documented in this report are preliminary and upon coder review may  be revised to meet current compliance requirements. Hildred Laser, MD Hildred Laser, MD 05/30/2020 8:37:54 AM This report has been signed electronically. Number of Addenda: 0

## 2020-05-31 ENCOUNTER — Other Ambulatory Visit: Payer: Self-pay

## 2020-05-31 LAB — SURGICAL PATHOLOGY

## 2020-06-03 ENCOUNTER — Encounter (HOSPITAL_COMMUNITY): Payer: Self-pay | Admitting: Internal Medicine

## 2020-06-04 ENCOUNTER — Telehealth (INDEPENDENT_AMBULATORY_CARE_PROVIDER_SITE_OTHER): Payer: Self-pay | Admitting: Internal Medicine

## 2020-06-04 NOTE — Telephone Encounter (Signed)
Patient called stated she was returning Dr Olevia Perches call - please advise - [phone]

## 2020-06-12 ENCOUNTER — Telehealth (INDEPENDENT_AMBULATORY_CARE_PROVIDER_SITE_OTHER): Payer: Self-pay | Admitting: Internal Medicine

## 2020-06-12 NOTE — Telephone Encounter (Signed)
Patient called left voice mail message stating Dr Laural Golden had been trying to get in touch with her about her test results - please advise - [phone]

## 2020-06-13 NOTE — Telephone Encounter (Signed)
Dr.Rehman states that he has called the patient numerous times. He would like to know how her swallowing is? Let her know that the Bx are good , No H-Pylori. She is to stay on the PPi until her OV in 3 months. She is to also keep a diary of the spells that she has for the 3 months.

## 2020-06-17 NOTE — Telephone Encounter (Signed)
Patient was called and a voicemail was left with Dr.Rehman's recommendations for the patient. Ask that if the patient had any questions to please call our office back , otherwise we will see her at  her appointment in September.

## 2020-06-18 ENCOUNTER — Telehealth (INDEPENDENT_AMBULATORY_CARE_PROVIDER_SITE_OTHER): Payer: Self-pay | Admitting: *Deleted

## 2020-06-18 NOTE — Telephone Encounter (Signed)
Patient was left a message on 06/17/2020 with the results from [place]. She called back and stated that she had questions about the EGD.  Patient called returned this morning. Her first question was - what happened to her esophagus that would have caused it to need to be stretched ?  Second Question  Is - that the same issue that she had with eating are still current. She states that her appetite has dwindled, she has to take a few bites of food to eat in order to swallow, why is this?  Dr.Rehman has been sent a message. Patient made aware that once I get his recommendations , I would call her back.

## 2020-06-18 NOTE — Telephone Encounter (Signed)
Per Dr.Rehman the patient had erosive esophagitis. He feels that patient needs to have a Upper Barium Study, depending on results may do Manometry.  Patient will be called in the morning and then given to Ann to arrange study.

## 2020-08-19 DIAGNOSIS — I10 Essential (primary) hypertension: Secondary | ICD-10-CM | POA: Diagnosis not present

## 2020-08-19 DIAGNOSIS — Z299 Encounter for prophylactic measures, unspecified: Secondary | ICD-10-CM | POA: Diagnosis not present

## 2020-08-19 DIAGNOSIS — J069 Acute upper respiratory infection, unspecified: Secondary | ICD-10-CM | POA: Diagnosis not present

## 2020-08-19 DIAGNOSIS — Z789 Other specified health status: Secondary | ICD-10-CM | POA: Diagnosis not present

## 2020-08-22 ENCOUNTER — Other Ambulatory Visit: Payer: Medicare Other

## 2020-08-22 ENCOUNTER — Other Ambulatory Visit: Payer: Self-pay

## 2020-08-22 DIAGNOSIS — Z20822 Contact with and (suspected) exposure to covid-19: Secondary | ICD-10-CM | POA: Diagnosis not present

## 2020-08-24 LAB — NOVEL CORONAVIRUS, NAA: SARS-CoV-2, NAA: NOT DETECTED

## 2020-09-02 ENCOUNTER — Ambulatory Visit (INDEPENDENT_AMBULATORY_CARE_PROVIDER_SITE_OTHER): Payer: Medicare Other | Admitting: Gastroenterology

## 2020-09-02 ENCOUNTER — Other Ambulatory Visit: Payer: Self-pay

## 2020-09-02 ENCOUNTER — Encounter (INDEPENDENT_AMBULATORY_CARE_PROVIDER_SITE_OTHER): Payer: Self-pay | Admitting: Gastroenterology

## 2020-09-02 VITALS — BP 133/84 | HR 92 | Temp 99.0°F | Ht 67.0 in | Wt 151.9 lb

## 2020-09-02 DIAGNOSIS — R05 Cough: Secondary | ICD-10-CM | POA: Diagnosis not present

## 2020-09-02 DIAGNOSIS — R0982 Postnasal drip: Secondary | ICD-10-CM | POA: Diagnosis not present

## 2020-09-02 DIAGNOSIS — R059 Cough, unspecified: Secondary | ICD-10-CM

## 2020-09-02 MED ORDER — PANTOPRAZOLE SODIUM 40 MG PO TBEC: 40.0000 mg | DELAYED_RELEASE_TABLET | Freq: Every day | ORAL | 1 refills | Status: DC

## 2020-09-02 NOTE — Progress Notes (Signed)
Patient profile: Jamie Alexander is a 64 y.o. female seen for follow up after EGD 05/2020.   History of Present Illness: Jamie Alexander is seen today for follow up - after EGD she started protonix 40mg  once a day. She feels protonix has helped some but continues to have symptoms. Mainly describes dry heaves due to throat clearing and spitting up mucus (thick, clear-white). Tried switching to zyrtec for one month but didn't help more than claritin and switched back. Feels like occasionally coughs so hard triggers nausea but no frequent nausea related to eating, etc. Husband does feel she has early satiety but patient denies any pain w/ eating, GERD, dysphagia. Wt stable compared to a year ago.    Bowels tend to be looser, usually about 3 Bm/day. No lower abd pain. No blood in stool or alternating constipation.    Takes advil few times a week for hip pain - hx hip replacement. Worse after active playing pickleball or yard work. Non smoker no alcohol.    Wt Readings from Last 3 Encounters:  09/02/20 151 lb 14.4 oz (68.9 kg)  05/30/20 155 lb (70.3 kg)  02/28/19 149 lb 4 oz (67.7 kg)     Last Colonoscopy: 05/2020 - small polyp transverse colon that appeared to be adenoma (Could not be retreived)  Last Endoscopy: - Normal hypopharynx. - LA Grade A reflux esophagitis. - No endoscopic esophageal abnormality to explain patient's dysphagia. Esophagus dilated. Dilated. - Gastritis. Biopsied. - Normal duodenal bulb and second portion of the duodenum.   Past Medical History:  Past Medical History:  Diagnosis Date  . Arthritis   . Hyperlipidemia   . Seasonal allergies     Problem List: Patient Active Problem List   Diagnosis Date Noted  . S/P right THA 02/28/2019  . Status post total hip replacement, right 02/28/2019    Past Surgical History: Past Surgical History:  Procedure Laterality Date  . BIOPSY  05/30/2020   Procedure: BIOPSY;  Surgeon: Rogene Houston, MD;   [facility]: [facility];  Service: Endoscopy;;  gastric  . BUNIONECTOMY Left   . COLONOSCOPY    . COLONOSCOPY N/A 05/30/2020   Procedure: COLONOSCOPY;  Surgeon: Rogene Houston, MD;  [facility]: [facility];  Service: Endoscopy;  Laterality: N/A;  730  . ESOPHAGOGASTRODUODENOSCOPY N/A 05/30/2020   Procedure: ESOPHAGOGASTRODUODENOSCOPY (EGD);  Surgeon: Rogene Houston, MD;  [facility];  Service: Endoscopy;  Laterality: N/A;  . POLYPECTOMY  05/30/2020   Procedure: POLYPECTOMY;  Surgeon: Rogene Houston, MD;  [facility];  Service: Endoscopy;;  . TOTAL HIP ARTHROPLASTY Right 02/28/2019   Procedure: TOTAL HIP ARTHROPLASTY ANTERIOR APPROACH;  Surgeon: Paralee Cancel, MD;  Location: [facility];  Service: Orthopedics;  Laterality: Right;    Allergies: No Known Allergies    Home Medications:  Current Outpatient Medications:  .  acyclovir (ZOVIRAX) 800 MG tablet, Take 800 mg by mouth daily as needed (cold sores)., Disp: , Rfl:  .  amoxicillin (AMOXIL) 500 MG capsule, Take 2,000 mg by mouth See admin instructions. Take 2000 mg 1 hour prior to dental work, Disp: , Rfl:  .  aspirin EC 81 MG tablet, Take 81 mg by mouth daily., Disp: , Rfl:  .  Cholecalciferol (VITAMIN D3) 125 MCG (5000 UT) CAPS, Take 5,000 Units by mouth daily., Disp: , Rfl:  .  ibuprofen (ADVIL) 200 MG tablet, Take 400 mg by mouth every 6 (six) hours as needed for moderate pain.,  Disp: , Rfl:  .  ibuprofen (ADVIL) 800 MG tablet, Take 800 mg by mouth at bedtime as needed for moderate pain., Disp: , Rfl:  .  loratadine (CLARITIN) 10 MG tablet, Take 10 mg by mouth daily., Disp: , Rfl:  .  Multiple Vitamin (MULTIVITAMIN WITH MINERALS) TABS tablet, Take 1 tablet by mouth daily., Disp: , Rfl:  .  Neomy-Bacit-Polymyx-Pramoxine (NEOSPORIN + PAIN/ITCH/SCAR) 1 % OINT, Apply 1 application topically daily as needed (itching)., Disp: , Rfl:  .  pantoprazole (PROTONIX) 40 MG tablet, Take 1 tablet (40 mg total) by mouth daily  before breakfast., Disp: 90 tablet, Rfl: 1 .  psyllium (REGULOID) 0.52 g capsule, Take 0.52 g by mouth daily., Disp: , Rfl:    Family History: Denies family hx colon polyps or colon cancer   Social History:   reports that she has never smoked. She has never used smokeless tobacco. She reports current alcohol use of about 14.0 standard drinks of alcohol per week. She reports that she does not use drugs.   Review of Systems: Constitutional: Denies weight loss/weight gain  Eyes: No changes in vision. ENT: No oral lesions, sore throat.  GI: see HPI.  Heme/Lymph: No easy bruising.  CV: No chest pain.  GU: No hematuria.  Integumentary: No rashes.  Neuro: No headaches.  Psych: No depression/anxiety.  Endocrine: No heat/cold intolerance.  Allergic/Immunologic: No urticaria.  Resp: No cough, SOB.  Musculoskeletal: No joint swelling.    Physical Examination: BP 133/84 (BP Location: Left Arm, Patient Position: Sitting, Cuff Size: Small)   Pulse 92   Temp 99 F (37.2 C) (Oral)   Ht 5\' 7"  (1.702 m)   Wt 151 lb 14.4 oz (68.9 kg)   BMI 23.79 kg/m  Gen: NAD, alert and oriented x 4 HEENT: PEERLA, EOMI, Neck: supple, no JVD Chest: CTA bilaterally, no wheezes, crackles, or other adventitious sounds CV: RRR, no m/g/c/r Abd: soft, NT, ND, +BS in all four quadrants; no HSM, guarding, ridigity, or rebound tenderness Ext: no edema, well perfused with 2+ pulses, Skin: no rash or lesions noted on observed skin Lymph: no noted LAD  Data Reviewed:  02/2019 labs reviewed   Assessment/Plan: Jamie Alexander is a 64 y.o. female seen for f/up of GERD   1. GERD - grade A esophagitis on recent EGD, will continue PPI. It has mildly helped her dry heaves/post nasal drip symptoms/cough w/ mucus. She saw ENT about 10 years ago but will place referral for new consult. She has tried claitin & zyretc. Currently has no UGI symptoms.   2. Hx colon polyps - due for repeat colonoscopy 2026.   Jamie Alexander was seen  today for follow-up.  Diagnoses and all orders for this visit:  Post-nasal drip -     Ambulatory referral to ENT  Cough -     Ambulatory referral to ENT  Other orders -     pantoprazole (PROTONIX) 40 MG tablet; Take 1 tablet (40 mg total) by mouth daily before breakfast.        I personally performed the service, non-incident to. (WP)  Laurine Blazer, [facility]

## 2020-09-02 NOTE — Patient Instructions (Signed)
We are referring to ENT. Continue protonix 40mg  once a day before breakfast. Avoid large doses of NSAID products.

## 2020-10-22 DIAGNOSIS — R053 Chronic cough: Secondary | ICD-10-CM | POA: Diagnosis not present

## 2020-10-22 DIAGNOSIS — K219 Gastro-esophageal reflux disease without esophagitis: Secondary | ICD-10-CM | POA: Diagnosis not present

## 2020-12-03 DIAGNOSIS — Z23 Encounter for immunization: Secondary | ICD-10-CM | POA: Diagnosis not present

## 2020-12-24 DIAGNOSIS — K219 Gastro-esophageal reflux disease without esophagitis: Secondary | ICD-10-CM | POA: Diagnosis not present

## 2020-12-24 DIAGNOSIS — R053 Chronic cough: Secondary | ICD-10-CM | POA: Diagnosis not present

## 2020-12-24 DIAGNOSIS — R070 Pain in throat: Secondary | ICD-10-CM | POA: Diagnosis not present

## 2021-02-07 DIAGNOSIS — Z6828 Body mass index (BMI) 28.0-28.9, adult: Secondary | ICD-10-CM | POA: Diagnosis not present

## 2021-02-07 DIAGNOSIS — Z299 Encounter for prophylactic measures, unspecified: Secondary | ICD-10-CM | POA: Diagnosis not present

## 2021-02-07 DIAGNOSIS — R5383 Other fatigue: Secondary | ICD-10-CM | POA: Diagnosis not present

## 2021-02-07 DIAGNOSIS — Z1339 Encounter for screening examination for other mental health and behavioral disorders: Secondary | ICD-10-CM | POA: Diagnosis not present

## 2021-02-07 DIAGNOSIS — E7800 Pure hypercholesterolemia, unspecified: Secondary | ICD-10-CM | POA: Diagnosis not present

## 2021-02-07 DIAGNOSIS — I10 Essential (primary) hypertension: Secondary | ICD-10-CM | POA: Diagnosis not present

## 2021-02-07 DIAGNOSIS — Z1331 Encounter for screening for depression: Secondary | ICD-10-CM | POA: Diagnosis not present

## 2021-02-07 DIAGNOSIS — Z7189 Other specified counseling: Secondary | ICD-10-CM | POA: Diagnosis not present

## 2021-02-07 DIAGNOSIS — Z79899 Other long term (current) drug therapy: Secondary | ICD-10-CM | POA: Diagnosis not present

## 2021-02-07 DIAGNOSIS — Z0000 Encounter for general adult medical examination without abnormal findings: Secondary | ICD-10-CM | POA: Diagnosis not present

## 2021-02-14 DIAGNOSIS — E039 Hypothyroidism, unspecified: Secondary | ICD-10-CM | POA: Diagnosis not present

## 2021-02-14 DIAGNOSIS — E2839 Other primary ovarian failure: Secondary | ICD-10-CM | POA: Diagnosis not present

## 2021-02-14 DIAGNOSIS — M818 Other osteoporosis without current pathological fracture: Secondary | ICD-10-CM | POA: Diagnosis not present

## 2021-03-05 DIAGNOSIS — W109XXA Fall (on) (from) unspecified stairs and steps, initial encounter: Secondary | ICD-10-CM | POA: Diagnosis not present

## 2021-03-05 DIAGNOSIS — Z96641 Presence of right artificial hip joint: Secondary | ICD-10-CM | POA: Diagnosis not present

## 2021-03-05 DIAGNOSIS — S300XXA Contusion of lower back and pelvis, initial encounter: Secondary | ICD-10-CM | POA: Diagnosis not present

## 2021-03-05 DIAGNOSIS — Z043 Encounter for examination and observation following other accident: Secondary | ICD-10-CM | POA: Diagnosis not present

## 2021-03-05 DIAGNOSIS — Z79899 Other long term (current) drug therapy: Secondary | ICD-10-CM | POA: Diagnosis not present

## 2021-03-05 DIAGNOSIS — Z7982 Long term (current) use of aspirin: Secondary | ICD-10-CM | POA: Diagnosis not present

## 2021-03-13 DIAGNOSIS — E039 Hypothyroidism, unspecified: Secondary | ICD-10-CM | POA: Diagnosis not present

## 2021-03-13 DIAGNOSIS — I10 Essential (primary) hypertension: Secondary | ICD-10-CM | POA: Diagnosis not present

## 2021-03-13 DIAGNOSIS — Z299 Encounter for prophylactic measures, unspecified: Secondary | ICD-10-CM | POA: Diagnosis not present

## 2021-03-13 DIAGNOSIS — S300XXA Contusion of lower back and pelvis, initial encounter: Secondary | ICD-10-CM | POA: Diagnosis not present

## 2021-03-20 ENCOUNTER — Other Ambulatory Visit: Payer: Self-pay | Admitting: Internal Medicine

## 2021-03-20 DIAGNOSIS — Z1231 Encounter for screening mammogram for malignant neoplasm of breast: Secondary | ICD-10-CM

## 2021-03-24 ENCOUNTER — Other Ambulatory Visit: Payer: Self-pay

## 2021-03-24 ENCOUNTER — Ambulatory Visit
Admission: RE | Admit: 2021-03-24 | Discharge: 2021-03-24 | Disposition: A | Discharge status: Home / Self Care | Payer: Medicare Other | Source: Ambulatory Visit | Attending: Internal Medicine | Admitting: Internal Medicine

## 2021-03-24 DIAGNOSIS — Z1231 Encounter for screening mammogram for malignant neoplasm of breast: Secondary | ICD-10-CM | POA: Diagnosis not present

## 2021-03-26 DIAGNOSIS — R053 Chronic cough: Secondary | ICD-10-CM | POA: Diagnosis not present

## 2021-03-26 DIAGNOSIS — K219 Gastro-esophageal reflux disease without esophagitis: Secondary | ICD-10-CM | POA: Diagnosis not present

## 2021-03-31 DIAGNOSIS — D171 Benign lipomatous neoplasm of skin and subcutaneous tissue of trunk: Secondary | ICD-10-CM | POA: Diagnosis not present

## 2021-03-31 DIAGNOSIS — S300XXA Contusion of lower back and pelvis, initial encounter: Secondary | ICD-10-CM | POA: Diagnosis not present

## 2021-03-31 DIAGNOSIS — M542 Cervicalgia: Secondary | ICD-10-CM | POA: Diagnosis not present

## 2021-03-31 DIAGNOSIS — I10 Essential (primary) hypertension: Secondary | ICD-10-CM | POA: Diagnosis not present

## 2021-03-31 DIAGNOSIS — Z299 Encounter for prophylactic measures, unspecified: Secondary | ICD-10-CM | POA: Diagnosis not present

## 2021-05-05 DIAGNOSIS — S300XXA Contusion of lower back and pelvis, initial encounter: Secondary | ICD-10-CM | POA: Diagnosis not present

## 2021-06-13 DIAGNOSIS — H40013 Open angle with borderline findings, low risk, bilateral: Secondary | ICD-10-CM | POA: Diagnosis not present

## 2021-06-17 DIAGNOSIS — Z23 Encounter for immunization: Secondary | ICD-10-CM | POA: Diagnosis not present

## 2021-07-17 DIAGNOSIS — H40013 Open angle with borderline findings, low risk, bilateral: Secondary | ICD-10-CM | POA: Diagnosis not present

## 2021-12-09 DIAGNOSIS — Z23 Encounter for immunization: Secondary | ICD-10-CM | POA: Diagnosis not present

## 2022-01-05 DIAGNOSIS — H40013 Open angle with borderline findings, low risk, bilateral: Secondary | ICD-10-CM | POA: Diagnosis not present

## 2022-02-09 DIAGNOSIS — E039 Hypothyroidism, unspecified: Secondary | ICD-10-CM | POA: Diagnosis not present

## 2022-02-09 DIAGNOSIS — Z0000 Encounter for general adult medical examination without abnormal findings: Secondary | ICD-10-CM | POA: Diagnosis not present

## 2022-02-09 DIAGNOSIS — Z299 Encounter for prophylactic measures, unspecified: Secondary | ICD-10-CM | POA: Diagnosis not present

## 2022-02-09 DIAGNOSIS — Z789 Other specified health status: Secondary | ICD-10-CM | POA: Diagnosis not present

## 2022-02-09 DIAGNOSIS — Z7189 Other specified counseling: Secondary | ICD-10-CM | POA: Diagnosis not present

## 2022-02-09 DIAGNOSIS — I10 Essential (primary) hypertension: Secondary | ICD-10-CM | POA: Diagnosis not present

## 2022-02-09 DIAGNOSIS — J3489 Other specified disorders of nose and nasal sinuses: Secondary | ICD-10-CM | POA: Diagnosis not present

## 2022-03-04 ENCOUNTER — Other Ambulatory Visit: Payer: Self-pay | Admitting: Internal Medicine

## 2022-03-04 ENCOUNTER — Other Ambulatory Visit: Payer: Self-pay

## 2022-03-04 DIAGNOSIS — Z1231 Encounter for screening mammogram for malignant neoplasm of breast: Secondary | ICD-10-CM

## 2022-04-01 ENCOUNTER — Ambulatory Visit
Admission: RE | Admit: 2022-04-01 | Discharge: 2022-04-01 | Disposition: A | Discharge status: Home / Self Care | Payer: Medicare Other | Source: Ambulatory Visit | Attending: Internal Medicine | Admitting: Internal Medicine

## 2022-04-01 DIAGNOSIS — Z1231 Encounter for screening mammogram for malignant neoplasm of breast: Secondary | ICD-10-CM

## 2022-07-07 DIAGNOSIS — H40013 Open angle with borderline findings, low risk, bilateral: Secondary | ICD-10-CM | POA: Diagnosis not present

## 2022-08-11 IMAGING — MG MM DIGITAL SCREENING BILAT W/ TOMO AND CAD
8 series · 8 of 24 positions shown · non-contrast
Comparison: Previous exam(s).

CLINICAL DATA: Screening.

EXAM:
DIGITAL SCREENING BILATERAL MAMMOGRAM WITH TOMOSYNTHESIS AND CAD
TECHNIQUE: Bilateral screening digital craniocaudal and mediolateral oblique
mammograms were obtained. Bilateral screening digital breast
tomosynthesis was performed. The images were evaluated with
computer-aided detection.

[R CC synth-2D]
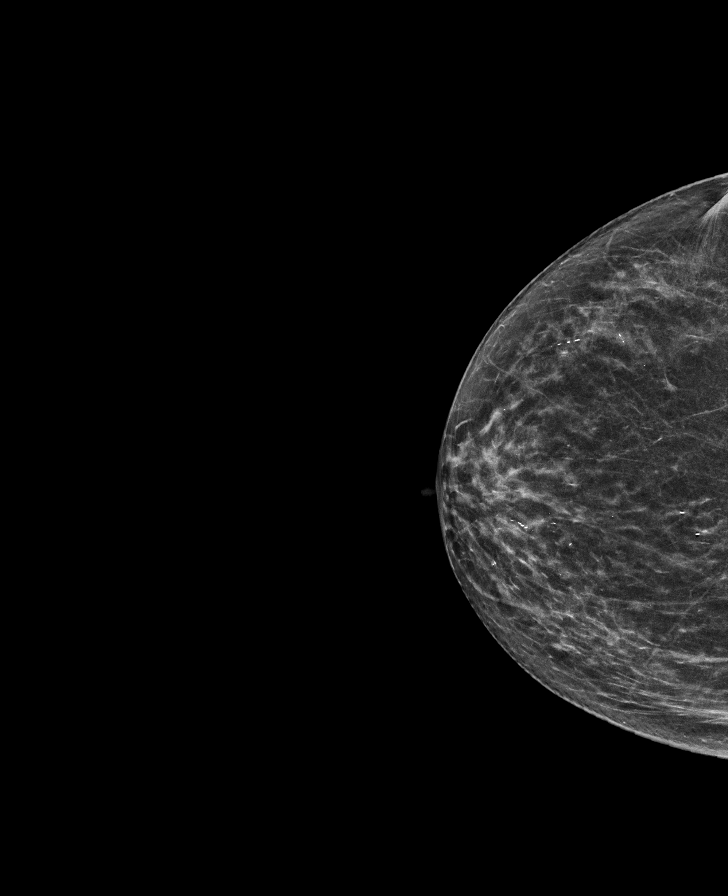

[L CC synth-2D]
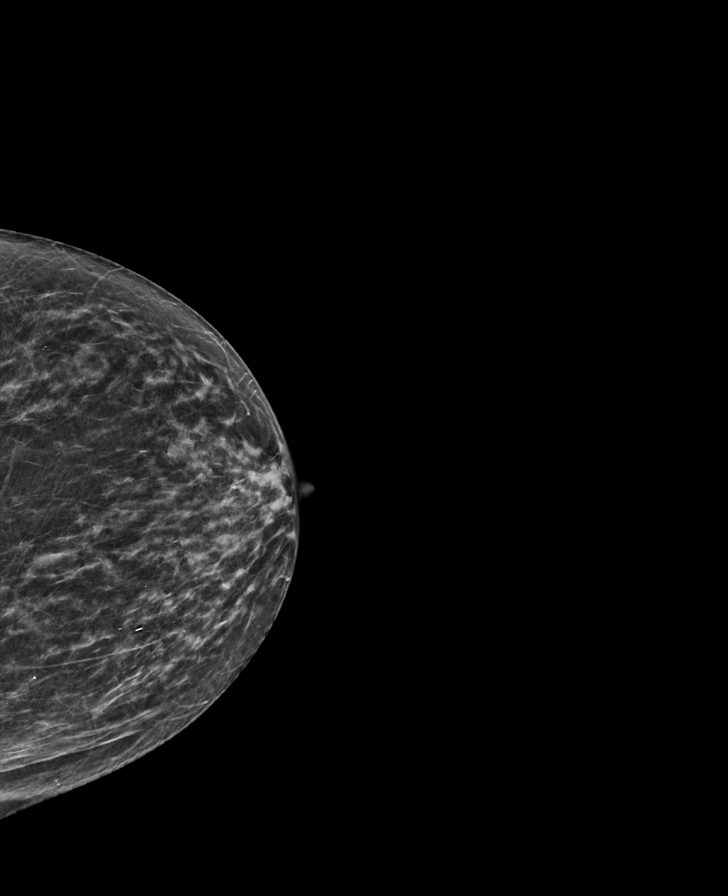

[L MLO synth-2D]
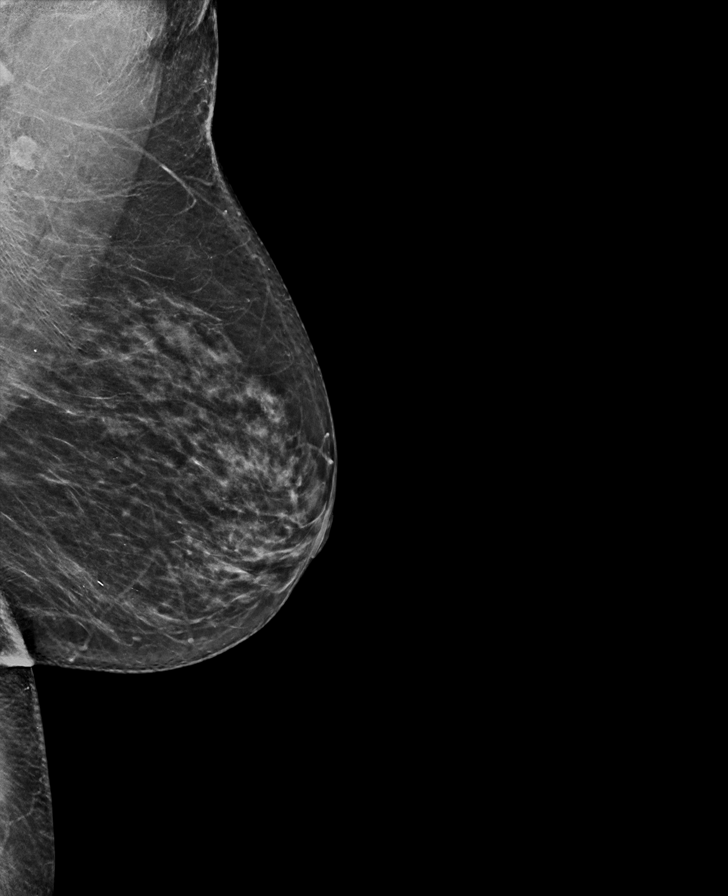

[R MLO synth-2D]
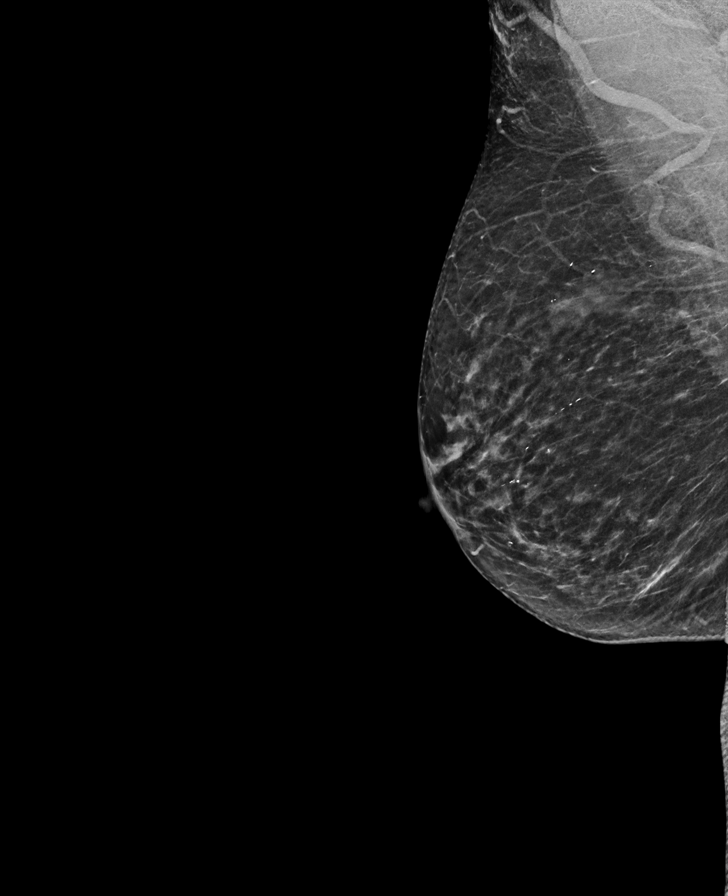

[R CC tomo · tomo slice 29/56.0]
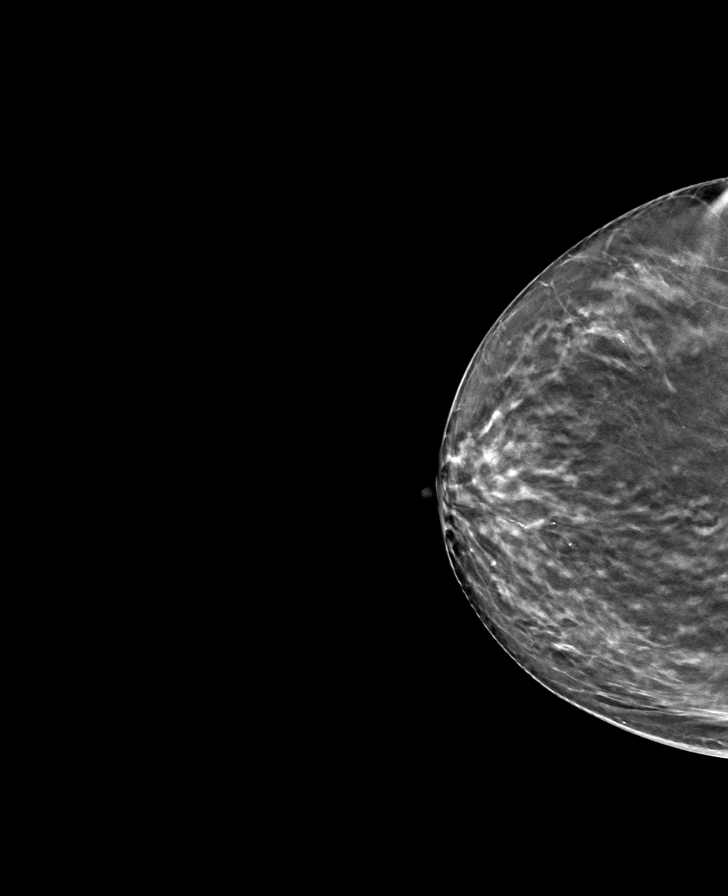

[L CC tomo · tomo slice 27/53.0]
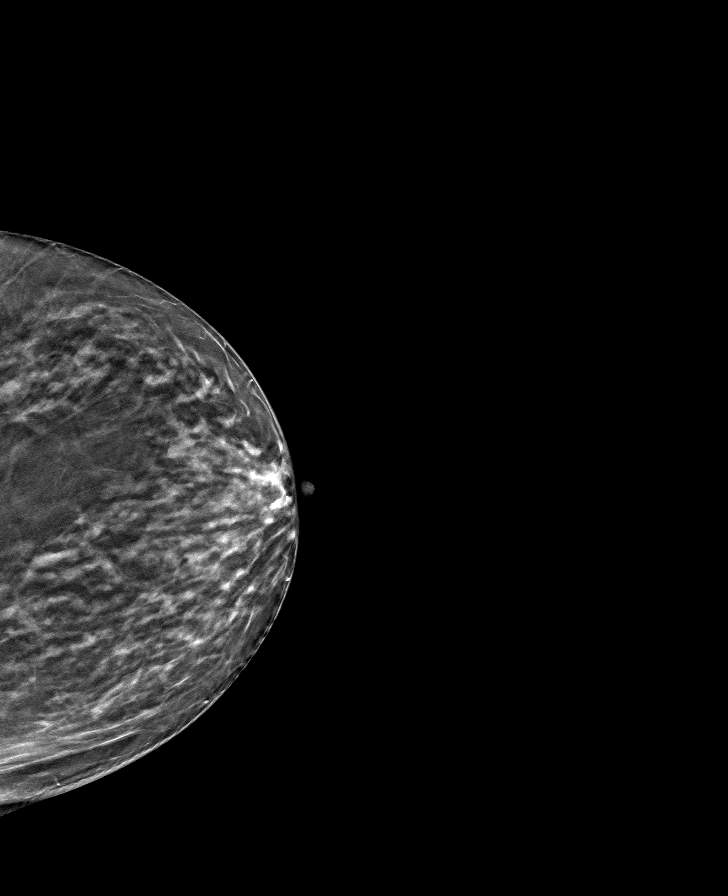

[L MLO tomo · tomo slice 31/62.0]
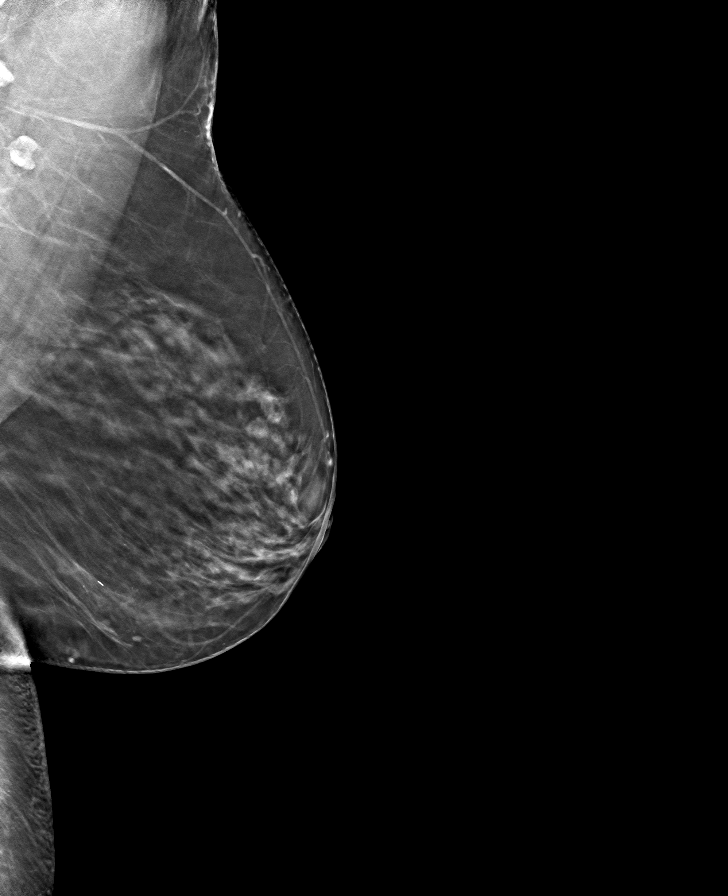

[R MLO tomo · tomo slice 30/59.0]
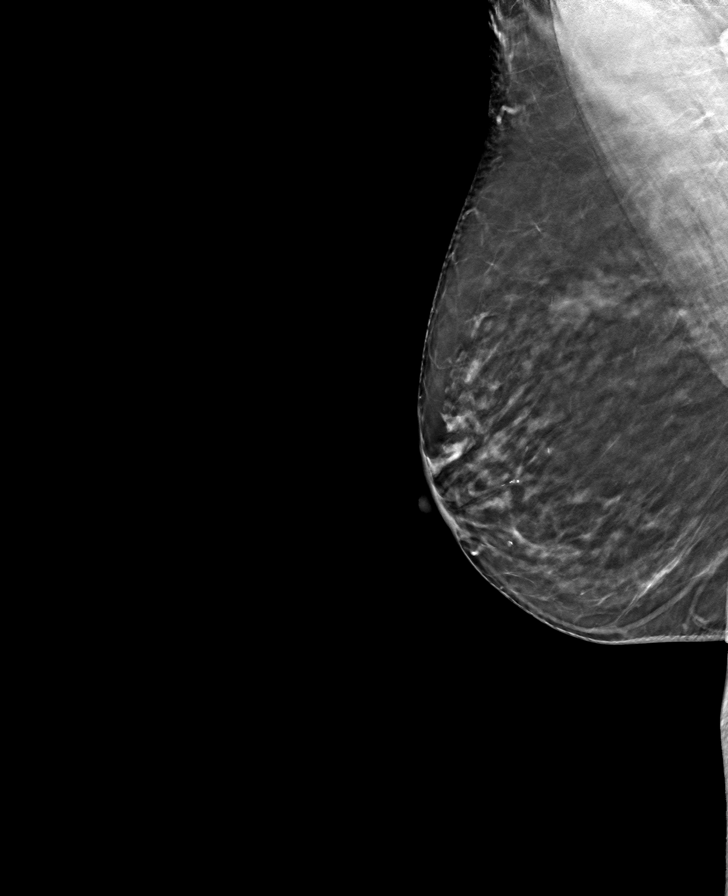

[8 of 24 positions shown; findings below may reference images not displayed]

ACR Breast Density Category b: There are scattered areas of
fibroglandular density.
FINDINGS: There are no findings suspicious for malignancy. The images were
evaluated with computer-aided detection.
IMPRESSION: No mammographic evidence of malignancy. A result letter of this
screening mammogram will be mailed directly to the patient.

RECOMMENDATION:
Screening mammogram in one year. (Code:[id number]-[id number])

BI-RADS CATEGORY  1: Negative.

## 2022-12-17 DIAGNOSIS — M7918 Myalgia, other site: Secondary | ICD-10-CM | POA: Diagnosis not present

## 2022-12-17 DIAGNOSIS — M25551 Pain in right hip: Secondary | ICD-10-CM | POA: Diagnosis not present

## 2022-12-17 DIAGNOSIS — M5136 Other intervertebral disc degeneration, lumbar region: Secondary | ICD-10-CM | POA: Diagnosis not present

## 2022-12-17 DIAGNOSIS — Z96641 Presence of right artificial hip joint: Secondary | ICD-10-CM | POA: Diagnosis not present

## 2022-12-17 DIAGNOSIS — M25512 Pain in left shoulder: Secondary | ICD-10-CM | POA: Diagnosis not present

## 2023-01-07 DIAGNOSIS — M5451 Vertebrogenic low back pain: Secondary | ICD-10-CM | POA: Diagnosis not present

## 2023-02-10 DIAGNOSIS — Z299 Encounter for prophylactic measures, unspecified: Secondary | ICD-10-CM | POA: Diagnosis not present

## 2023-02-10 DIAGNOSIS — I10 Essential (primary) hypertension: Secondary | ICD-10-CM | POA: Diagnosis not present

## 2023-02-10 DIAGNOSIS — E039 Hypothyroidism, unspecified: Secondary | ICD-10-CM | POA: Diagnosis not present

## 2023-02-10 DIAGNOSIS — M47819 Spondylosis without myelopathy or radiculopathy, site unspecified: Secondary | ICD-10-CM | POA: Diagnosis not present

## 2023-02-10 DIAGNOSIS — Z0000 Encounter for general adult medical examination without abnormal findings: Secondary | ICD-10-CM | POA: Diagnosis not present

## 2023-02-10 DIAGNOSIS — Z7189 Other specified counseling: Secondary | ICD-10-CM | POA: Diagnosis not present

## 2023-03-31 ENCOUNTER — Other Ambulatory Visit: Payer: Self-pay | Admitting: Internal Medicine

## 2023-03-31 DIAGNOSIS — Z1231 Encounter for screening mammogram for malignant neoplasm of breast: Secondary | ICD-10-CM

## 2023-04-13 DIAGNOSIS — Q390 Atresia of esophagus without fistula: Secondary | ICD-10-CM | POA: Diagnosis not present

## 2023-04-13 DIAGNOSIS — I10 Essential (primary) hypertension: Secondary | ICD-10-CM | POA: Diagnosis not present

## 2023-04-13 DIAGNOSIS — J309 Allergic rhinitis, unspecified: Secondary | ICD-10-CM | POA: Diagnosis not present

## 2023-04-13 DIAGNOSIS — Z299 Encounter for prophylactic measures, unspecified: Secondary | ICD-10-CM | POA: Diagnosis not present

## 2023-04-13 DIAGNOSIS — J392 Other diseases of pharynx: Secondary | ICD-10-CM | POA: Diagnosis not present

## 2023-04-16 ENCOUNTER — Encounter (INDEPENDENT_AMBULATORY_CARE_PROVIDER_SITE_OTHER): Payer: Self-pay | Admitting: *Deleted

## 2023-05-04 ENCOUNTER — Ambulatory Visit
Admission: RE | Admit: 2023-05-04 | Discharge: 2023-05-04 | Disposition: A | Discharge status: Home / Self Care | Payer: Medicare Other | Source: Ambulatory Visit | Attending: Internal Medicine | Admitting: Internal Medicine

## 2023-05-04 DIAGNOSIS — Z1231 Encounter for screening mammogram for malignant neoplasm of breast: Secondary | ICD-10-CM

## 2023-05-25 ENCOUNTER — Encounter (INDEPENDENT_AMBULATORY_CARE_PROVIDER_SITE_OTHER): Payer: Self-pay | Admitting: Gastroenterology

## 2023-05-25 ENCOUNTER — Telehealth (INDEPENDENT_AMBULATORY_CARE_PROVIDER_SITE_OTHER): Payer: Self-pay | Admitting: Gastroenterology

## 2023-05-25 ENCOUNTER — Ambulatory Visit (INDEPENDENT_AMBULATORY_CARE_PROVIDER_SITE_OTHER): Payer: Medicare Other | Admitting: Gastroenterology

## 2023-05-25 VITALS — BP 124/85 | HR 88 | Temp 98.2°F | Ht 67.0 in | Wt 170.6 lb

## 2023-05-25 DIAGNOSIS — R1110 Vomiting, unspecified: Secondary | ICD-10-CM | POA: Diagnosis not present

## 2023-05-25 DIAGNOSIS — R0989 Other specified symptoms and signs involving the circulatory and respiratory systems: Secondary | ICD-10-CM | POA: Diagnosis not present

## 2023-05-25 NOTE — Progress Notes (Addendum)
Referring Provider: Kirstie Peri, MD Primary Care Physician:  Kirstie Peri, MD Primary GI Physician: previously [facility]   Chief Complaint  Patient presents with   esophageal atresia    Referred for esophageal atresia. Having to constantly clear throat, coughing, has spells of dry heaves. Has to at times smack herself to breath.    HPI:   Jamie Alexander is a 67 y.o. female with past medical history of arthritis, HLD, GERD   Patient presenting today for coughing/dry heaving   Last seen here in 2021, recommended to have ENT evaluation for dry heaves/post nasal drip.   Present:  Patient reports that she has had issues with her throat for about 20 years. She will have issues with dry heaving, notes sometimes she feels like she needs to vomit, has issues where she feels like she cannot breathe or as if she is choking, sometimes has to spit up her saliva. She sometimes has to smack her chest to help her breathe. Symptoms are intermittent, cannot pinpoint any specific triggers. Episodes can happen even when she is not eating. She had esophageal dilation in the past and felt that helped some. Notes severity of episodes has worsened though she does not feel that frequency has. She reports she saw ENT previously and was told she had oversensitive nerves in her throat and she was placed on "nerve pills" that she notes did not help. She tries to take small bites of food so that she does not have issues with dysphagia. She denies any nausea other than when these episodes occur. Denies heartburn or obvious acid regurgitation. She tries to avoid GERD trigger foods. No weight loss. She feels that she needs to clear her throat often, worse upon waking in the morning with globus sensation. No sore throat. No hoarseness. No esophageal or chest pain. No early satiety.   She does note sinus issues which she takes xyzal for, recently started this, has been on claritin for about 20 years. She denies any  improvement in the above episodes with initiation of xyzal.   Last Colonoscopy: 2021 - One small polyp in the proximal transverse colon,                            removed with a cold snare. Complete resection.                            Polyp tissue not retrieved.                           comment: polyp appeared to be an adenoma.  Last Endoscopy: 2021 - Normal hypopharynx.                           - LA Grade A reflux esophagitis.                           - No endoscopic esophageal abnormality to explain                            patient's dysphagia. Esophagus dilated. Dilated.                           - Gastritis. Biopsied.                           -  Normal duodenal bulb and second portion of the                            duodenum.  Recommendations:  Repeat colonoscopy in 2028  Past Medical History:  Diagnosis Date   Arthritis    Hyperlipidemia    Seasonal allergies     Past Surgical History:  Procedure Laterality Date   BIOPSY  05/30/2020   Procedure: BIOPSY;  Surgeon: Malissa Hippo, MD;  [facility];  Service: Endoscopy;;  gastric   BUNIONECTOMY Left    COLONOSCOPY     COLONOSCOPY N/A 05/30/2020   Procedure: COLONOSCOPY;  Surgeon: Malissa Hippo, MD;  [facility];  Service: Endoscopy;  Laterality: N/A;  730   ESOPHAGOGASTRODUODENOSCOPY N/A 05/30/2020   Procedure: ESOPHAGOGASTRODUODENOSCOPY (EGD);  Surgeon: Malissa Hippo, MD;  [facility];  Service: Endoscopy;  Laterality: N/A;   POLYPECTOMY  05/30/2020   Procedure: POLYPECTOMY;  Surgeon: Malissa Hippo, MD;  [facility];  Service: Endoscopy;;   TOTAL HIP ARTHROPLASTY Right 02/28/2019   Procedure: TOTAL HIP ARTHROPLASTY ANTERIOR APPROACH;  Surgeon: Durene Romans, MD;  Location: [facility];  Service: Orthopedics;  Laterality: Right;    Current Outpatient Medications  Medication Sig Dispense Refill   acyclovir (ZOVIRAX) 800 MG tablet Take 800 mg by mouth daily as  needed (cold sores).     aspirin EC 81 MG tablet Take 81 mg by mouth daily.     Cholecalciferol (VITAMIN D3) 125 MCG (5000 UT) CAPS Take 5,000 Units by mouth daily.     Coenzyme Q10 (COQ10 PO) Take by mouth. 200mg  daily     diclofenac (VOLTAREN) 75 MG EC tablet Take 75 mg by mouth 2 (two) times daily.     ibuprofen (ADVIL) 200 MG tablet Take 400 mg by mouth every 6 (six) hours as needed for moderate pain.     ibuprofen (ADVIL) 800 MG tablet Take 800 mg by mouth at bedtime as needed for moderate pain.     levocetirizine (XYZAL) 5 MG tablet Take 5 mg by mouth every evening.     loratadine (CLARITIN) 10 MG tablet Take 10 mg by mouth daily.     Multiple Vitamin (MULTIVITAMIN WITH MINERALS) TABS tablet Take 1 tablet by mouth daily.     Neomy-Bacit-Polymyx-Pramoxine (NEOSPORIN + PAIN/ITCH/SCAR) 1 % OINT Apply 1 application topically daily as needed (itching).     Psyllium (METAMUCIL PO) Take by mouth. Take two daily     rosuvastatin (CRESTOR) 20 MG tablet Take 20 mg by mouth daily.     No current facility-administered medications for this visit.    Allergies as of 05/25/2023   (No Known Allergies)    Family History  Problem Relation Age of Onset   Breast cancer Mother    Breast cancer Paternal Grandmother     Social History   Socioeconomic History   Marital status: Married    Spouse name: Not on file   Number of children: Not on file   Years of education: Not on file   Highest education level: Not on file  Occupational History   Not on file  Tobacco Use   Smoking status: Never    Passive exposure: Past   Smokeless tobacco: Never  Vaping Use   Vaping Use: Never used  Substance and Sexual Activity   Alcohol use: Yes    Alcohol/week: 14.0 standard drinks of alcohol  Types: 14 Cans of beer per week   Drug use: Never   Sexual activity: Not on file  Other Topics Concern   Not on file  Social History Narrative   Not on file   Social Determinants of Health   Financial  Resource Strain: Not on file  Food Insecurity: Not on file  Transportation Needs: Not on file  Physical Activity: Not on file  Stress: Not on file  Social Connections: Not on file   Review of systems General: negative for malaise, night sweats, fever, chills, weight loss Neck: Negative for lumps, goiter, pain and significant neck swelling Resp: Negative for cough, wheezing, dyspnea at rest CV: Negative for chest pain, leg swelling, palpitations, orthopnea GI: denies melena, hematochezia, nausea, vomiting, diarrhea, constipation, dysphagia, odyonophagia, early satiety or unintentional weight loss. +coughing/dry heaving episodes  MSK: Negative for joint pain or swelling, back pain, and muscle pain. Derm: Negative for itching or rash Psych: Denies depression, anxiety, memory loss, confusion. No homicidal or suicidal ideation.  Heme: Negative for prolonged bleeding, bruising easily, and swollen nodes. Endocrine: Negative for cold or heat intolerance, polyuria, polydipsia and goiter. Neuro: negative for tremor, gait imbalance, syncope and seizures. The remainder of the review of systems is noncontributory.  Physical Exam: BP 124/85 (BP Location: Left Arm, Patient Position: Sitting, Cuff Size: Normal)   Pulse 88   Temp 98.2 F (36.8 C) (Oral)   Ht 5\' 7"  (1.702 m)   Wt 170 lb 9.6 oz (77.4 kg)   BMI 26.72 kg/m  General:   Alert and oriented. No distress noted. Pleasant and cooperative.  Head:  Normocephalic and atraumatic. Eyes:  Conjuctiva clear without scleral icterus. Mouth:  Oral mucosa pink and moist. Good dentition. No lesions. Heart: Normal rate and rhythm, s1 and s2 heart sounds present.  Lungs: Clear lung sounds in all lobes. Respirations equal and unlabored. Abdomen:  +BS, soft, non-tender and non-distended. No rebound or guarding. No HSM or masses noted. Derm: No palmar erythema or jaundice Msk:  Symmetrical without gross deformities. Normal posture. Extremities:  Without  edema. Neurologic:  Alert and  oriented x4 Psych:  Alert and cooperative. Normal mood and affect.  Invalid input(s): "6 MONTHS"   ASSESSMENT: Jamie Alexander is a 67 y.o. female presenting today for coughing/dry heaving.  Intermittent episodes of coughing/dry heaving/feeling like she is choking for the past 20 years, per patient. Denies dysphagia with food or drink or GERD symptoms. Episodes can occur even at times she is not eating. Notably evaluated by ENT in the past and told she had overactive nerves in her throat, however, patient feels there is something else underlying causing her issues. Last EGD with dilation in 2021 with some noted improvement in symptoms. She does endorse a lot of sinus issues for which she is on antihistamines for without improvement in these coughing/dry heaving episodes. Low likelihood this is esophageal spasms as she denies any pain with the episodes.  I'm not convinced her symptoms are GI related, however, we can proceed with EGD to rule out GI causes of her symptoms, especially as she felt that esophageal dilation in the past improved these episodes. She may benefit from ENT referral to have evaluation done by them again if EGD is unremarkable. Indications, risks and benefits of procedure discussed in detail with patient. Patient verbalized understanding and is in agreement to proceed with EGD +/- dilation.  PLAN:  Schedule EGD-ASA II  2. May consider ENT referral pending findings of EGD  All  questions were answered, patient verbalized understanding and is in agreement with plan as outlined above.   Follow Up: 3 months   Jamie Kamps L. Jeanmarie Hubert, MSN, APRN, AGNP-C Adult-Gerontology Nurse Practitioner [facility]  I have reviewed the note and agree with the APP's assessment as described in this progress note  Patient presented chronic symptoms of esophageal complaints and dry heaving.  Will repeat an EGD to further evaluate this but  may require further evaluation with an esophagram and an esophageal manometry.  Is very possible her symptoms are related to functional bowel hypersensitivity.  Katrinka Blazing, MD Gastroenterology and Hepatology [facility]

## 2023-05-25 NOTE — Patient Instructions (Signed)
We will get you scheduled for upper endoscopy for further evaluation of your symptoms Further recommendations to follow pending results of endoscopic evaluation  Follow up 3 months

## 2023-05-25 NOTE — Telephone Encounter (Signed)
Pt returned call. EGD scheduled. No PA needed per [facility]. Instructions will be mailed to pt.

## 2023-05-25 NOTE — Telephone Encounter (Signed)
Left message to return call to schedule EGD ASA 2.

## 2023-05-25 NOTE — H&P (View-Only) (Signed)
 Referring Provider: Shah, Ashish, MD Primary Care Physician:  Shah, Ashish, MD Primary GI Physician: previously Rehman   Chief Complaint  Patient presents with   esophageal atresia    Referred for esophageal atresia. Having to constantly clear throat, coughing, has spells of dry heaves. Has to at times smack herself to breath.    HPI:   Jamie Alexander is a 67 y.o. female with past medical history of arthritis, HLD, GERD   Patient presenting today for coughing/dry heaving   Last seen here in 2021, recommended to have ENT evaluation for dry heaves/post nasal drip.   Present:  Patient reports that she has had issues with her throat for about 20 years. She will have issues with dry heaving, notes sometimes she feels like she needs to vomit, has issues where she feels like she cannot breathe or as if she is choking, sometimes has to spit up her saliva. She sometimes has to smack her chest to help her breathe. Symptoms are intermittent, cannot pinpoint any specific triggers. Episodes can happen even when she is not eating. She had esophageal dilation in the past and felt that helped some. Notes severity of episodes has worsened though she does not feel that frequency has. She reports she saw ENT previously and was told she had oversensitive nerves in her throat and she was placed on "nerve pills" that she notes did not help. She tries to take small bites of food so that she does not have issues with dysphagia. She denies any nausea other than when these episodes occur. Denies heartburn or obvious acid regurgitation. She tries to avoid GERD trigger foods. No weight loss. She feels that she needs to clear her throat often, worse upon waking in the morning with globus sensation. No sore throat. No hoarseness. No esophageal or chest pain. No early satiety.   She does note sinus issues which she takes xyzal for, recently started this, has been on claritin for about 20 years. She denies any  improvement in the above episodes with initiation of xyzal.   Last Colonoscopy: 2021 - One small polyp in the proximal transverse colon,                            removed with a cold snare. Complete resection.                            Polyp tissue not retrieved.                           comment: polyp appeared to be an adenoma.  Last Endoscopy: 2021 - Normal hypopharynx.                           - LA Grade A reflux esophagitis.                           - No endoscopic esophageal abnormality to explain                            patient's dysphagia. Esophagus dilated. Dilated.                           - Gastritis. Biopsied.                           -   Normal duodenal bulb and second portion of the                            duodenum.  Recommendations:  Repeat colonoscopy in 2028  Past Medical History:  Diagnosis Date   Arthritis    Hyperlipidemia    Seasonal allergies     Past Surgical History:  Procedure Laterality Date   BIOPSY  05/30/2020   Procedure: BIOPSY;  Surgeon: Rehman, Najeeb U, MD;  Location: [facility];  Service: Endoscopy;;  gastric   BUNIONECTOMY Left    COLONOSCOPY     COLONOSCOPY N/A 05/30/2020   Procedure: COLONOSCOPY;  Surgeon: Rehman, Najeeb U, MD;  Location: [facility];  Service: Endoscopy;  Laterality: N/A;  730   ESOPHAGOGASTRODUODENOSCOPY N/A 05/30/2020   Procedure: ESOPHAGOGASTRODUODENOSCOPY (EGD);  Surgeon: Rehman, Najeeb U, MD;  Location: [facility];  Service: Endoscopy;  Laterality: N/A;   POLYPECTOMY  05/30/2020   Procedure: POLYPECTOMY;  Surgeon: Rehman, Najeeb U, MD;  Location: [facility];  Service: Endoscopy;;   TOTAL HIP ARTHROPLASTY Right 02/28/2019   Procedure: TOTAL HIP ARTHROPLASTY ANTERIOR APPROACH;  Surgeon: Olin, Matthew, MD;  Location: [facility];  Service: Orthopedics;  Laterality: Right;    Current Outpatient Medications  Medication Sig Dispense Refill   acyclovir (ZOVIRAX) 800 MG tablet Take 800 mg by mouth daily as  needed (cold sores).     aspirin EC 81 MG tablet Take 81 mg by mouth daily.     Cholecalciferol (VITAMIN D3) 125 MCG (5000 UT) CAPS Take 5,000 Units by mouth daily.     Coenzyme Q10 (COQ10 PO) Take by mouth. 200mg daily     diclofenac (VOLTAREN) 75 MG EC tablet Take 75 mg by mouth 2 (two) times daily.     ibuprofen (ADVIL) 200 MG tablet Take 400 mg by mouth every 6 (six) hours as needed for moderate pain.     ibuprofen (ADVIL) 800 MG tablet Take 800 mg by mouth at bedtime as needed for moderate pain.     levocetirizine (XYZAL) 5 MG tablet Take 5 mg by mouth every evening.     loratadine (CLARITIN) 10 MG tablet Take 10 mg by mouth daily.     Multiple Vitamin (MULTIVITAMIN WITH MINERALS) TABS tablet Take 1 tablet by mouth daily.     Neomy-Bacit-Polymyx-Pramoxine (NEOSPORIN + PAIN/ITCH/SCAR) 1 % OINT Apply 1 application topically daily as needed (itching).     Psyllium (METAMUCIL PO) Take by mouth. Take two daily     rosuvastatin (CRESTOR) 20 MG tablet Take 20 mg by mouth daily.     No current facility-administered medications for this visit.    Allergies as of 05/25/2023   (No Known Allergies)    Family History  Problem Relation Age of Onset   Breast cancer Mother    Breast cancer Paternal Grandmother     Social History   Socioeconomic History   Marital status: Married    Spouse name: Not on file   Number of children: Not on file   Years of education: Not on file   Highest education level: Not on file  Occupational History   Not on file  Tobacco Use   Smoking status: Never    Passive exposure: Past   Smokeless tobacco: Never  Vaping Use   Vaping Use: Never used  Substance and Sexual Activity   Alcohol use: Yes    Alcohol/week: 14.0 standard drinks of alcohol      Types: 14 Cans of beer per week   Drug use: Never   Sexual activity: Not on file  Other Topics Concern   Not on file  Social History Narrative   Not on file   Social Determinants of Health   Financial  Resource Strain: Not on file  Food Insecurity: Not on file  Transportation Needs: Not on file  Physical Activity: Not on file  Stress: Not on file  Social Connections: Not on file   Review of systems General: negative for malaise, night sweats, fever, chills, weight loss Neck: Negative for lumps, goiter, pain and significant neck swelling Resp: Negative for cough, wheezing, dyspnea at rest CV: Negative for chest pain, leg swelling, palpitations, orthopnea GI: denies melena, hematochezia, nausea, vomiting, diarrhea, constipation, dysphagia, odyonophagia, early satiety or unintentional weight loss. +coughing/dry heaving episodes  MSK: Negative for joint pain or swelling, back pain, and muscle pain. Derm: Negative for itching or rash Psych: Denies depression, anxiety, memory loss, confusion. No homicidal or suicidal ideation.  Heme: Negative for prolonged bleeding, bruising easily, and swollen nodes. Endocrine: Negative for cold or heat intolerance, polyuria, polydipsia and goiter. Neuro: negative for tremor, gait imbalance, syncope and seizures. The remainder of the review of systems is noncontributory.  Physical Exam: BP 124/85 (BP Location: Left Arm, Patient Position: Sitting, Cuff Size: Normal)   Pulse 88   Temp 98.2 F (36.8 C) (Oral)   Ht 5' 7" (1.702 m)   Wt 170 lb 9.6 oz (77.4 kg)   BMI 26.72 kg/m  General:   Alert and oriented. No distress noted. Pleasant and cooperative.  Head:  Normocephalic and atraumatic. Eyes:  Conjuctiva clear without scleral icterus. Mouth:  Oral mucosa pink and moist. Good dentition. No lesions. Heart: Normal rate and rhythm, s1 and s2 heart sounds present.  Lungs: Clear lung sounds in all lobes. Respirations equal and unlabored. Abdomen:  +BS, soft, non-tender and non-distended. No rebound or guarding. No HSM or masses noted. Derm: No palmar erythema or jaundice Msk:  Symmetrical without gross deformities. Normal posture. Extremities:  Without  edema. Neurologic:  Alert and  oriented x4 Psych:  Alert and cooperative. Normal mood and affect.  Invalid input(s): "6 MONTHS"   ASSESSMENT: Lashawndra Lynne Orris Robotham is a 67 y.o. female presenting today for coughing/dry heaving.  Intermittent episodes of coughing/dry heaving/feeling like she is choking for the past 20 years, per patient. Denies dysphagia with food or drink or GERD symptoms. Episodes can occur even at times she is not eating. Notably evaluated by ENT in the past and told she had overactive nerves in her throat, however, patient feels there is something else underlying causing her issues. Last EGD with dilation in 2021 with some noted improvement in symptoms. She does endorse a lot of sinus issues for which she is on antihistamines for without improvement in these coughing/dry heaving episodes. Low likelihood this is esophageal spasms as she denies any pain with the episodes.  I'm not convinced her symptoms are GI related, however, we can proceed with EGD to rule out GI causes of her symptoms, especially as she felt that esophageal dilation in the past improved these episodes. She may benefit from ENT referral to have evaluation done by them again if EGD is unremarkable. Indications, risks and benefits of procedure discussed in detail with patient. Patient verbalized understanding and is in agreement to proceed with EGD +/- dilation.  PLAN:  Schedule EGD-ASA II  2. May consider ENT referral pending findings of EGD  All   questions were answered, patient verbalized understanding and is in agreement with plan as outlined above.   Follow Up: 3 months   Rowdy Guerrini L. Betta Balla, MSN, APRN, AGNP-C Adult-Gerontology Nurse Practitioner [facility]  I have reviewed the note and agree with the APP's assessment as described in this progress note  Patient presented chronic symptoms of esophageal complaints and dry heaving.  Will repeat an EGD to further evaluate this but  may require further evaluation with an esophagram and an esophageal manometry.  Is very possible her symptoms are related to functional bowel hypersensitivity.  Daniel Castaneda, MD Gastroenterology and Hepatology [facility]  

## 2023-05-26 DIAGNOSIS — R1110 Vomiting, unspecified: Secondary | ICD-10-CM | POA: Insufficient documentation

## 2023-05-26 DIAGNOSIS — R0989 Other specified symptoms and signs involving the circulatory and respiratory systems: Secondary | ICD-10-CM | POA: Insufficient documentation

## 2023-06-03 ENCOUNTER — Telehealth (INDEPENDENT_AMBULATORY_CARE_PROVIDER_SITE_OTHER): Payer: Self-pay | Admitting: Gastroenterology

## 2023-06-03 NOTE — Telephone Encounter (Signed)
Spoke with patient today, all questions were answered.  We thoroughly discussed the possibility of increased bowel hypersensitivity causing her globus sensation and other chronic gastrointestinal symptoms.

## 2023-06-03 NOTE — Telephone Encounter (Signed)
Pt left voicemail in regards to questions about EGD on 06/23/23  Returned call to pt. Spoke with husband (Archie-DPR). Husband was asking about the witness section on the cancellation policy. Advised pt that if pt mails or brings the cancellation policy by the office I would witness it. Husband wanted to see if she could bring policy by office on the day she is having her EGD, I advised that would be ok.  Pt is wanting to speak to provider prior to EGD. Please advise. Thank you

## 2023-06-16 DIAGNOSIS — E7800 Pure hypercholesterolemia, unspecified: Secondary | ICD-10-CM | POA: Diagnosis not present

## 2023-06-16 DIAGNOSIS — R5383 Other fatigue: Secondary | ICD-10-CM | POA: Diagnosis not present

## 2023-06-16 DIAGNOSIS — Z299 Encounter for prophylactic measures, unspecified: Secondary | ICD-10-CM | POA: Diagnosis not present

## 2023-06-16 DIAGNOSIS — E559 Vitamin D deficiency, unspecified: Secondary | ICD-10-CM | POA: Diagnosis not present

## 2023-06-16 DIAGNOSIS — Z79899 Other long term (current) drug therapy: Secondary | ICD-10-CM | POA: Diagnosis not present

## 2023-06-16 DIAGNOSIS — I10 Essential (primary) hypertension: Secondary | ICD-10-CM | POA: Diagnosis not present

## 2023-06-16 DIAGNOSIS — E039 Hypothyroidism, unspecified: Secondary | ICD-10-CM | POA: Diagnosis not present

## 2023-06-16 DIAGNOSIS — Z0000 Encounter for general adult medical examination without abnormal findings: Secondary | ICD-10-CM | POA: Diagnosis not present

## 2023-06-22 DIAGNOSIS — R7301 Impaired fasting glucose: Secondary | ICD-10-CM | POA: Diagnosis not present

## 2023-06-22 DIAGNOSIS — I10 Essential (primary) hypertension: Secondary | ICD-10-CM | POA: Diagnosis not present

## 2023-06-22 DIAGNOSIS — R748 Abnormal levels of other serum enzymes: Secondary | ICD-10-CM | POA: Diagnosis not present

## 2023-06-22 DIAGNOSIS — Z299 Encounter for prophylactic measures, unspecified: Secondary | ICD-10-CM | POA: Diagnosis not present

## 2023-06-23 ENCOUNTER — Ambulatory Visit (HOSPITAL_COMMUNITY): Payer: Medicare Other | Admitting: Anesthesiology

## 2023-06-23 ENCOUNTER — Ambulatory Visit (HOSPITAL_COMMUNITY)
Admission: RE | Admit: 2023-06-23 | Discharge: 2023-06-23 | Disposition: A | Discharge status: Home / Self Care | Payer: Medicare Other | Source: Ambulatory Visit | Attending: Gastroenterology | Admitting: Gastroenterology

## 2023-06-23 ENCOUNTER — Encounter (HOSPITAL_COMMUNITY): Payer: Self-pay | Admitting: Gastroenterology

## 2023-06-23 ENCOUNTER — Telehealth (INDEPENDENT_AMBULATORY_CARE_PROVIDER_SITE_OTHER): Payer: Self-pay | Admitting: Gastroenterology

## 2023-06-23 ENCOUNTER — Other Ambulatory Visit: Payer: Self-pay

## 2023-06-23 ENCOUNTER — Encounter (HOSPITAL_COMMUNITY)
Admission: RE | Disposition: A | Discharge status: Home / Self Care | Payer: Self-pay | Source: Ambulatory Visit | Attending: Gastroenterology

## 2023-06-23 DIAGNOSIS — M1990 Unspecified osteoarthritis, unspecified site: Secondary | ICD-10-CM | POA: Insufficient documentation

## 2023-06-23 DIAGNOSIS — K2990 Gastroduodenitis, unspecified, without bleeding: Secondary | ICD-10-CM

## 2023-06-23 DIAGNOSIS — K449 Diaphragmatic hernia without obstruction or gangrene: Secondary | ICD-10-CM

## 2023-06-23 DIAGNOSIS — K2950 Unspecified chronic gastritis without bleeding: Secondary | ICD-10-CM | POA: Diagnosis not present

## 2023-06-23 DIAGNOSIS — Q402 Other specified congenital malformations of stomach: Secondary | ICD-10-CM

## 2023-06-23 DIAGNOSIS — Z791 Long term (current) use of non-steroidal anti-inflammatories (NSAID): Secondary | ICD-10-CM | POA: Insufficient documentation

## 2023-06-23 DIAGNOSIS — E785 Hyperlipidemia, unspecified: Secondary | ICD-10-CM | POA: Insufficient documentation

## 2023-06-23 DIAGNOSIS — K2970 Gastritis, unspecified, without bleeding: Secondary | ICD-10-CM

## 2023-06-23 DIAGNOSIS — K3189 Other diseases of stomach and duodenum: Secondary | ICD-10-CM

## 2023-06-23 DIAGNOSIS — K31A0 Gastric intestinal metaplasia, unspecified: Secondary | ICD-10-CM | POA: Insufficient documentation

## 2023-06-23 DIAGNOSIS — K219 Gastro-esophageal reflux disease without esophagitis: Secondary | ICD-10-CM | POA: Insufficient documentation

## 2023-06-23 DIAGNOSIS — R1115 Cyclical vomiting syndrome unrelated to migraine: Secondary | ICD-10-CM | POA: Diagnosis not present

## 2023-06-23 DIAGNOSIS — R1110 Vomiting, unspecified: Secondary | ICD-10-CM | POA: Diagnosis present

## 2023-06-23 DIAGNOSIS — K259 Gastric ulcer, unspecified as acute or chronic, without hemorrhage or perforation: Secondary | ICD-10-CM | POA: Diagnosis not present

## 2023-06-23 DIAGNOSIS — Z79899 Other long term (current) drug therapy: Secondary | ICD-10-CM | POA: Insufficient documentation

## 2023-06-23 DIAGNOSIS — R059 Cough, unspecified: Secondary | ICD-10-CM | POA: Diagnosis not present

## 2023-06-23 DIAGNOSIS — R110 Nausea: Secondary | ICD-10-CM | POA: Diagnosis not present

## 2023-06-23 HISTORY — PX: ESOPHAGOGASTRODUODENOSCOPY (EGD) WITH PROPOFOL: SHX5813

## 2023-06-23 HISTORY — PX: BIOPSY: SHX5522

## 2023-06-23 SURGERY — ESOPHAGOGASTRODUODENOSCOPY (EGD) WITH PROPOFOL
Anesthesia: General

## 2023-06-23 MED ORDER — LACTATED RINGERS IV SOLN: INTRAVENOUS | Status: DC

## 2023-06-23 MED ORDER — LACTATED RINGERS IV SOLN
INTRAVENOUS | Status: DC
  Administered 2023-06-23: 1000 mL via INTRAVENOUS

## 2023-06-23 MED ORDER — OMEPRAZOLE 40 MG PO CPDR: 40.0000 mg | DELAYED_RELEASE_CAPSULE | Freq: Every day | ORAL | 3 refills | Status: DC

## 2023-06-23 MED ORDER — LIDOCAINE 2% (20 MG/ML) 5 ML SYRINGE
INTRAMUSCULAR | Status: DC | PRN
  Administered 2023-06-23: 60 mg via INTRAVENOUS

## 2023-06-23 MED ORDER — PROPOFOL 500 MG/50ML IV EMUL
INTRAVENOUS | Status: DC | PRN
  Administered 2023-06-23: 200 ug/kg/min via INTRAVENOUS

## 2023-06-23 MED ORDER — PROPOFOL 10 MG/ML IV BOLUS
INTRAVENOUS | Status: DC | PRN
  Administered 2023-06-23: 80 mg via INTRAVENOUS

## 2023-06-23 NOTE — Discharge Instructions (Signed)
You are being discharged to home.  Resume your previous diet.  We are waiting for your pathology results.  Do not take any ibuprofen (including Advil, Motrin or Nuprin), naproxen, or other non-steroidal anti-inflammatory drugs.  Take Prilosec (omeprazole) 40 mg by mouth once a day. Schedule abdominal pelvis CT with IV contrast to evaluate gastric deformity.

## 2023-06-23 NOTE — Addendum Note (Signed)
Addended by: Marlowe Shores on: 06/23/2023 10:41 AM   Modules accepted: Orders

## 2023-06-23 NOTE — Telephone Encounter (Signed)
CT abdomen/pelvis with contrast ordered and scheduled. Insurance no longer requires pre auth for CT's. Pt scheduled for 08/11/23 at 5:00pm. Pt to arrive at [facility] at 4:30. NPO 4 hours prior. Will mail appt letter. Will call pt on Friday 06/25/23 due to pt having procedure done this morning.

## 2023-06-23 NOTE — Interval H&P Note (Signed)
History and Physical Interval Note:  06/23/2023 8:34 AM  Jamie Alexander  has presented today for surgery, with the diagnosis of nausea, cough.  The various methods of treatment have been discussed with the patient and family. After consideration of risks, benefits and other options for treatment, the patient has consented to  Procedure(s) with comments: ESOPHAGOGASTRODUODENOSCOPY (EGD) WITH PROPOFOL (N/A) - 9:00am;asa 2 as a surgical intervention.  The patient's history has been reviewed, patient examined, no change in status, stable for surgery.  I have reviewed the patient's chart and labs.  Questions were answered to the patient's satisfaction.     Katrinka Blazing Mayorga

## 2023-06-23 NOTE — Transfer of Care (Signed)
Immediate Anesthesia Transfer of Care Note  Patient: Jamie Alexander  Procedure(s) Performed: ESOPHAGOGASTRODUODENOSCOPY (EGD) WITH PROPOFOL BIOPSY  Patient Location: Endoscopy Unit  Anesthesia Type:General  Level of Consciousness: awake, alert , and oriented  Airway & Oxygen Therapy: Patient Spontanous Breathing  Post-op Assessment: Report given to RN and Post -op Vital signs reviewed and stable  Post vital signs: Reviewed and stable  Last Vitals:  Vitals Value Taken Time  BP 120/80   Temp 98   Pulse 62   Resp 16   SpO2 98     Last Pain:  Vitals:   06/23/23 0744  TempSrc: Axillary  PainSc: 0-No pain      Patients Stated Pain Goal: 8 (06/23/23 0744)  Complications: No notable events documented.

## 2023-06-23 NOTE — Telephone Encounter (Signed)
Dolores Frame, MD  Marlowe Shores, LPN Hi Kenney Houseman,  Can you please schedule a CT abdomen and pelvis with IV contrast? Dx: gastric deformity. Thanks,  Katrinka Blazing, MD Gastroenterology and Hepatology [facility]

## 2023-06-23 NOTE — Anesthesia Preprocedure Evaluation (Signed)
Anesthesia Evaluation  Patient identified by MRN, date of birth, ID band Patient awake    Reviewed: Allergy & Precautions, H&P , NPO status , Patient's Chart, lab work & pertinent test results, reviewed documented beta blocker date and time   Airway Mallampati: II  TM Distance: >3 FB Neck ROM: full    Dental no notable dental hx.    Pulmonary neg pulmonary ROS   Pulmonary exam normal breath sounds clear to auscultation       Cardiovascular Exercise Tolerance: Good negative cardio ROS  Rhythm:regular Rate:Normal     Neuro/Psych negative neurological ROS  negative psych ROS   GI/Hepatic negative GI ROS, Neg liver ROS,,,  Endo/Other  negative endocrine ROS    Renal/GU negative Renal ROS  negative genitourinary   Musculoskeletal   Abdominal   Peds  Hematology negative hematology ROS (+)   Anesthesia Other Findings   Reproductive/Obstetrics negative OB ROS                             Anesthesia Physical Anesthesia Plan  ASA: 2  Anesthesia Plan: General   Post-op Pain Management:    Induction:   PONV Risk Score and Plan: Propofol infusion  Airway Management Planned:   Additional Equipment:   Intra-op Plan:   Post-operative Plan:   Informed Consent: I have reviewed the patients History and Physical, chart, labs and discussed the procedure including the risks, benefits and alternatives for the proposed anesthesia with the patient or authorized representative who has indicated his/her understanding and acceptance.     Dental Advisory Given  Plan Discussed with: CRNA  Anesthesia Plan Comments:        Anesthesia Quick Evaluation  

## 2023-06-23 NOTE — Op Note (Signed)
[facility] Patient Name: Jamie Alexander Procedure Date: 06/23/2023 8:36 AM MRN: [id number] Date of Birth: 10/19/1956 Attending MD: Katrinka Blazing , , [id number] CSN: [id number] Age: 67 Admit Type: Outpatient Procedure:                Upper GI endoscopy Indications:              Persistent retching of unknown cause Providers:                Katrinka Blazing, Angelica Ran, Zena Amos Referring MD:              Medicines:                Monitored Anesthesia Care Complications:            No immediate complications. Estimated Blood Loss:     Estimated blood loss: none. Procedure:                Pre-Anesthesia Assessment:                           - Prior to the procedure, a History and Physical                            was performed, and patient medications, allergies                            and sensitivities were reviewed. The patient's                            tolerance of previous anesthesia was reviewed.                           - The risks and benefits of the procedure and the                            sedation options and risks were discussed with the                            patient. All questions were answered and informed                            consent was obtained.                           - ASA Grade Assessment: II - A patient with mild                            systemic disease.                           After obtaining informed consent, the endoscope was                            passed under direct vision. Throughout the                            procedure, the patient's  blood pressure, pulse, and                            oxygen saturations were monitored continuously. The                            [id number]-[id number] ([id number]) scope was introduced through the                            mouth, and advanced to the second part of duodenum.                            The upper GI endoscopy was accomplished without                            difficulty. The  patient tolerated the procedure                            well. Scope In: 9:10:41 AM Scope Out: 9:18:17 AM Total Procedure Duration: 0 hours 7 minutes 36 seconds  Findings:      A 1 cm hiatal hernia was present.      The gastroesophageal flap valve was visualized endoscopically and       classified as Hill Grade II (fold present, opens with respiration).      A deformity was found in the gastric body.      Patchy mild inflammation characterized by erosions and erythema was       found in the gastric antrum. Biopsies were taken with a cold forceps for       Helicobacter pylori testing.      The examined duodenum was normal. Biopsies were taken with a cold       forceps for histology. Impression:               - 1 cm hiatal hernia.                           - Deformity in the gastric body.                           - Gastritis. Biopsied.                           - Normal examined duodenum. Biopsied. Moderate Sedation:      Per Anesthesia Care Recommendation:           - Discharge patient to home (ambulatory).                           - Resume previous diet.                           - Await pathology results.                           - No ibuprofen, naproxen, or other non-steroidal  anti-inflammatory drugs.                           - Use Prilosec (omeprazole) 40 mg PO daily.                           - Schedule abdominal pelvis CT with IV contrast to                            evaluate gastric deformity. Procedure Code(s):        --- Professional ---                           [phone], Esophagogastroduodenoscopy, flexible,                            transoral; with biopsy, single or multiple Diagnosis Code(s):        --- Professional ---                           K44.9, Diaphragmatic hernia without obstruction or                            gangrene                           K31.89, Other diseases of stomach and duodenum                           K29.70,  Gastritis, unspecified, without bleeding                           R11.15, Cyclical vomiting syndrome unrelated to                            migraine CPT copyright 2022 American Medical Association. All rights reserved. The codes documented in this report are preliminary and upon coder review may  be revised to meet current compliance requirements. Katrinka Blazing, MD Katrinka Blazing,  06/23/2023 9:27:43 AM This report has been signed electronically. Number of Addenda: 0

## 2023-06-24 NOTE — Anesthesia Postprocedure Evaluation (Signed)
Anesthesia Post Note  Patient: Jamie Alexander  Procedure(s) Performed: ESOPHAGOGASTRODUODENOSCOPY (EGD) WITH PROPOFOL BIOPSY  Patient location during evaluation: Phase II Anesthesia Type: General Level of consciousness: awake Pain management: pain level controlled Vital Signs Assessment: post-procedure vital signs reviewed and stable Respiratory status: spontaneous breathing and respiratory function stable Cardiovascular status: blood pressure returned to baseline and stable Postop Assessment: no headache and no apparent nausea or vomiting Anesthetic complications: no Comments: Late entry   No notable events documented.   Last Vitals:  Vitals:   06/23/23 0744 06/23/23 0920  BP: 136/78 (!) 103/51  Pulse: 78 76  Resp: 17 20  Temp: 36.5 C 36.5 C  SpO2: 96% 95%    Last Pain:  Vitals:   06/23/23 0920  TempSrc: Oral  PainSc: 0-No pain                 Windell Norfolk

## 2023-06-25 LAB — SURGICAL PATHOLOGY

## 2023-06-28 ENCOUNTER — Telehealth (INDEPENDENT_AMBULATORY_CARE_PROVIDER_SITE_OTHER): Payer: Self-pay | Admitting: *Deleted

## 2023-06-28 ENCOUNTER — Encounter (INDEPENDENT_AMBULATORY_CARE_PROVIDER_SITE_OTHER): Payer: Self-pay | Admitting: *Deleted

## 2023-06-28 NOTE — Telephone Encounter (Signed)
Left message with husband to return call

## 2023-06-28 NOTE — Telephone Encounter (Signed)
Dr. Levon Hedger. Pt did have some questions about if she is able to return to circuit training and playing pickleball. She states she does not think she could start back on stomach crunches but wants to know if ok to do arms, legs and back exercises.

## 2023-06-28 NOTE — Telephone Encounter (Signed)
Hi,  Yes, no restrictions from my side Thanks

## 2023-06-28 NOTE — Telephone Encounter (Signed)
Pt returned call but nurse unable to get to phone Returned call and gave information to husband Archie.

## 2023-06-28 NOTE — Telephone Encounter (Signed)
Called patient back and discussed results of biopsy and let her know a letter was mailed that she would be getting soon. Discussed letter below:  A. SMALL BOWEL, BIOPSY: - Benign small bowel mucosa with no significant pathologic changes   B. STOMACH, BIOPSY: - Chronic inactive gastritis with intestinal metaplasia and background reactive changes - Negative for H. pylori on HE stain - Negative for dysplasia or malignancy     What does this mean? The biopsies from the small bowel were normal. No precancerous changes were noted.     The samples from your stomach showed intestinal metaplasia.  This is a change in the lining of the stomach which is seen in patients that have a slightly high risk of developing gastric cancer in the future.  This risk is higher if there is family history of gastric cancer or if coming from areas with increased risk of cancer such [place] or [place].  You do not meet any of these criteria but I will still recommend keeping a close eye on this. Due to this, I will recommend a repeat upper endoscopy in 3 years.   We will follow the result of the CT of the abdomen.    Please call us at [phone] if you have persistent problems or have questions about your condition that have not been fully answered at this time.  Patient verbalized understanding of all.

## 2023-06-28 NOTE — Telephone Encounter (Signed)
Patient left vm asking for biopsy results.   [phone]

## 2023-06-29 NOTE — Telephone Encounter (Signed)
Pt notified no restrictions from GI standpoint and then she wanted to know what caused her to have this condition. Is it stuff she is eating or drinking?

## 2023-06-29 NOTE — Telephone Encounter (Signed)
Pt requested that I call her around lunch time and not this morning. Will call later today

## 2023-06-29 NOTE — Telephone Encounter (Signed)
For her current symptoms, we will follow the result of the scheduled CT scan.  For the "gastric intestinal metaplasia" findings in biopsy, this could have been by a previous infection in the past.

## 2023-06-30 ENCOUNTER — Encounter (HOSPITAL_COMMUNITY): Payer: Self-pay | Admitting: Gastroenterology

## 2023-06-30 NOTE — Telephone Encounter (Signed)
Left message to return call 

## 2023-06-30 NOTE — Telephone Encounter (Signed)
Discussed with patient per Dr. Levon Hedger -  For her current symptoms, we will follow the result of the scheduled CT scan.   For the "gastric intestinal metaplasia" findings in biopsy, this could have been by a previous infection in the past.        Patient verbalized understanding.

## 2023-07-08 DIAGNOSIS — H40013 Open angle with borderline findings, low risk, bilateral: Secondary | ICD-10-CM | POA: Diagnosis not present

## 2023-07-23 DIAGNOSIS — R748 Abnormal levels of other serum enzymes: Secondary | ICD-10-CM | POA: Diagnosis not present

## 2023-07-23 DIAGNOSIS — Z299 Encounter for prophylactic measures, unspecified: Secondary | ICD-10-CM | POA: Diagnosis not present

## 2023-07-23 DIAGNOSIS — I10 Essential (primary) hypertension: Secondary | ICD-10-CM | POA: Diagnosis not present

## 2023-07-23 DIAGNOSIS — K219 Gastro-esophageal reflux disease without esophagitis: Secondary | ICD-10-CM | POA: Diagnosis not present

## 2023-08-11 ENCOUNTER — Encounter (HOSPITAL_COMMUNITY): Payer: Self-pay | Admitting: Radiology

## 2023-08-11 ENCOUNTER — Ambulatory Visit (HOSPITAL_COMMUNITY)
Admission: RE | Admit: 2023-08-11 | Discharge: 2023-08-11 | Disposition: A | Discharge status: Home / Self Care | Payer: Medicare Other | Source: Ambulatory Visit | Attending: Gastroenterology | Admitting: Gastroenterology

## 2023-08-11 ENCOUNTER — Telehealth (INDEPENDENT_AMBULATORY_CARE_PROVIDER_SITE_OTHER): Payer: Self-pay | Admitting: *Deleted

## 2023-08-11 DIAGNOSIS — Q402 Other specified congenital malformations of stomach: Secondary | ICD-10-CM | POA: Insufficient documentation

## 2023-08-11 MED ORDER — IOHEXOL 350 MG/ML SOLN
80.0000 mL | Freq: Once | INTRAVENOUS | Status: AC | PRN
  Administered 2023-08-11: 80 mL via INTRAVENOUS

## 2023-08-21 DIAGNOSIS — M542 Cervicalgia: Secondary | ICD-10-CM | POA: Diagnosis not present

## 2023-08-21 DIAGNOSIS — R030 Elevated blood-pressure reading, without diagnosis of hypertension: Secondary | ICD-10-CM | POA: Diagnosis not present

## 2023-09-02 NOTE — Telephone Encounter (Signed)
error 

## 2023-09-09 ENCOUNTER — Telehealth (INDEPENDENT_AMBULATORY_CARE_PROVIDER_SITE_OTHER): Payer: Self-pay | Admitting: Gastroenterology

## 2023-09-09 NOTE — Telephone Encounter (Signed)
Pt left voicemail in regards to getting a call about confirming appt with [facility] next week.  Returned call to patient but had to leave message to return call

## 2023-09-16 ENCOUNTER — Ambulatory Visit (INDEPENDENT_AMBULATORY_CARE_PROVIDER_SITE_OTHER): Payer: Medicare Other | Admitting: Gastroenterology

## 2023-09-16 ENCOUNTER — Encounter (INDEPENDENT_AMBULATORY_CARE_PROVIDER_SITE_OTHER): Payer: Self-pay | Admitting: Gastroenterology

## 2023-09-16 VITALS — BP 131/85 | HR 118 | Temp 98.8°F | Ht 66.0 in | Wt 155.9 lb

## 2023-09-16 DIAGNOSIS — K5903 Drug induced constipation: Secondary | ICD-10-CM

## 2023-09-16 DIAGNOSIS — K219 Gastro-esophageal reflux disease without esophagitis: Secondary | ICD-10-CM

## 2023-09-16 DIAGNOSIS — R1110 Vomiting, unspecified: Secondary | ICD-10-CM

## 2023-09-16 DIAGNOSIS — K5900 Constipation, unspecified: Secondary | ICD-10-CM | POA: Insufficient documentation

## 2023-09-16 NOTE — Patient Instructions (Signed)
Continue with omeprazole 40mg  daily Start taking Miralax 1 capful every day for one week. If bowel movements do not improve, increase to 1 capful every 12 hours. If after two weeks there is no improvement, increase to 1 capful every 8 hours Increase water intake, aim for atleast 64 oz per day Increase fruits, veggies and whole grains, kiwi and prunes are especially good for constipation  Please let me know if constipation is not improving with the above interventions  Follow up 6 months  It was a pleasure to see you today. I want to create trusting relationships with patients and provide genuine, compassionate, and quality care. I truly value your feedback! please be on the lookout for a survey regarding your visit with me today. I appreciate your input about our visit and your time in completing this!    Sneijder Bernards L. Jeanmarie Hubert, MSN, APRN, AGNP-C Adult-Gerontology Nurse Practitioner [facility] Gastroenterology at [facility]

## 2023-09-16 NOTE — Progress Notes (Addendum)
Referring Provider: Kirstie Peri, MD Primary Care Physician:  Kirstie Peri, MD Primary GI Physician: Dr. Levon Hedger   Chief Complaint  Patient presents with   Gastroesophageal Reflux    Follow up on GERD. Since starting omeprazole states she has not been having any choking senstations or dry heaves.    Constipation    Having some new issues with constipation. Took dulcolax one day an it did help. Having to strain to have BM.    HPI:   Jamie Alexander is a 67 y.o. female with past medical history of arthritis, HLD, GERD   Patient presenting today for dry heaves/GERD and with constipation  Last seen June 2024, at that time having dry heaving. Sometimes choking. Saw ENT who felt she had nerve sensitivity in her throat.   Recommended to schedule EGD, started on omeprazole 40mg  thereafter.  Present: States she is doing well on omeprazole 40mg  daily, she has not had any further episodes of dry heaving since she started this. Denies any GERD symptoms. No dysphagia.  She is having some issues with constipation. This began when she had to start cyclobenzaprine for a neck strain at the beginning of this month. She notes she took this for about 6 days and started having blisters in her mouth and the constipation, which has continued. She had 11 days out of this month so far where she did not have a BM. She did take a dulcolax which helped some but notes stools are very hard and she is having to strain to defecate. Drinking about 3 bottles of water per day. No rectal bleeding or melena    Last Colonoscopy: 2021 - One small polyp in the proximal transverse colon,                            removed with a cold snare. Complete resection.                            Polyp tissue not retrieved.                           comment: polyp appeared to be an adenoma.  Last Endoscopy:-06/2023 1 cm hiatal hernia.                           - Deformity in the gastric body.                           -  Gastritis. Biopsied.                           - Normal examined duodenum. Biopsied. A. SMALL BOWEL, BIOPSY: - Benign small bowel mucosa with no significant pathologic changes   B. STOMACH, BIOPSY: - Chronic inactive gastritis with intestinal metaplasia and background reactive changes - Negative for H. pylori on HE stain - Negative for dysplasia or malignancy  Recommendations:  Repeat colonoscopy 2028 Repeat EGD in 3 years   Past Medical History:  Diagnosis Date   Arthritis    Hyperlipidemia    Seasonal allergies     Past Surgical History:  Procedure Laterality Date   BIOPSY  05/30/2020   Procedure: BIOPSY;  Surgeon: Malissa Hippo, MD;  [facility];  Service:  Endoscopy;;  gastric   BIOPSY  06/23/2023   Procedure: BIOPSY;  Surgeon: Dolores Frame, MD;  [facility];  Service: Gastroenterology;;   BUNIONECTOMY Left    COLONOSCOPY     COLONOSCOPY N/A 05/30/2020   Procedure: COLONOSCOPY;  Surgeon: Malissa Hippo, MD;  [facility];  Service: Endoscopy;  Laterality: N/A;  730   ESOPHAGOGASTRODUODENOSCOPY N/A 05/30/2020   Procedure: ESOPHAGOGASTRODUODENOSCOPY (EGD);  Surgeon: Malissa Hippo, MD;  [facility];  Service: Endoscopy;  Laterality: N/A;   ESOPHAGOGASTRODUODENOSCOPY (EGD) WITH PROPOFOL N/A 06/23/2023   Procedure: ESOPHAGOGASTRODUODENOSCOPY (EGD) WITH PROPOFOL;  Surgeon: Dolores Frame, MD;  [facility];  Service: Gastroenterology;  Laterality: N/A;  9:00am;asa 2   POLYPECTOMY  05/30/2020   Procedure: POLYPECTOMY;  Surgeon: Malissa Hippo, MD;  [facility];  Service: Endoscopy;;   TOTAL HIP ARTHROPLASTY Right 02/28/2019   Procedure: TOTAL HIP ARTHROPLASTY ANTERIOR APPROACH;  Surgeon: Durene Romans, MD;  [facility]: [facility];  Service: Orthopedics;  Laterality: Right;    Current Outpatient Medications  Medication Sig Dispense Refill   acyclovir (ZOVIRAX) 800 MG tablet Take 800 mg by  mouth daily as needed (cold sores).     aspirin EC 81 MG tablet Take 81 mg by mouth daily.     Cholecalciferol (VITAMIN D3) 125 MCG (5000 UT) CAPS Take 5,000 Units by mouth daily.     levocetirizine (XYZAL) 5 MG tablet Take 5 mg by mouth every evening.     Multiple Vitamin (MULTIVITAMIN WITH MINERALS) TABS tablet Take 1 tablet by mouth daily.     Neomy-Bacit-Polymyx-Pramoxine (NEOSPORIN + PAIN/ITCH/SCAR) 1 % OINT Apply 1 application topically daily as needed (itching).     omeprazole (PRILOSEC) 40 MG capsule Take 1 capsule (40 mg total) by mouth daily. 90 capsule 3   Psyllium (METAMUCIL PO) Take 2 capsules by mouth daily.     rosuvastatin (CRESTOR) 20 MG tablet Take 20 mg by mouth every evening.     No current facility-administered medications for this visit.    Allergies as of 09/16/2023 - Review Complete 09/16/2023  Allergen Reaction Noted   Flexeril [cyclobenzaprine] Anaphylaxis 09/16/2023    Family History  Problem Relation Age of Onset   Breast cancer Mother    Breast cancer Paternal Grandmother     Social History   Socioeconomic History   Marital status: Married    Spouse name: Not on file   Number of children: Not on file   Years of education: Not on file   Highest education level: Not on file  Occupational History   Not on file  Tobacco Use   Smoking status: Never    Passive exposure: Past   Smokeless tobacco: Never  Vaping Use   Vaping status: Never Used  Substance and Sexual Activity   Alcohol use: Not Currently    Comment: drinks 4 beer per day   Drug use: Never   Sexual activity: Not on file  Other Topics Concern   Not on file  Social History Narrative   Not on file   Social Determinants of Health   Financial Resource Strain: Not on file  Food Insecurity: Not on file  Transportation Needs: Not on file  Physical Activity: Not on file  Stress: Not on file  Social Connections: Not on file    Review of systems General: negative for malaise, night  sweats, fever, chills, weight loss Neck: Negative for lumps, goiter, pain and significant neck swelling Resp:  Negative for cough, wheezing, dyspnea at rest CV: Negative for chest pain, leg swelling, palpitations, orthopnea GI: denies melena, hematochezia, nausea, vomiting, diarrhea, dysphagia, odyonophagia, early satiety or unintentional weight loss. +constipation  MSK: Negative for joint pain or swelling, back pain, and muscle pain. Derm: Negative for itching or rash Psych: Denies depression, anxiety, memory loss, confusion. No homicidal or suicidal ideation.  Heme: Negative for prolonged bleeding, bruising easily, and swollen nodes. Endocrine: Negative for cold or heat intolerance, polyuria, polydipsia and goiter. Neuro: negative for tremor, gait imbalance, syncope and seizures. The remainder of the review of systems is noncontributory.  Physical Exam: BP 131/85 (BP Location: Left Arm, Patient Position: Sitting, Cuff Size: Normal)   Pulse (!) 118   Temp 98.8 F (37.1 C) (Oral)   Ht 5\' 6"  (1.676 m)   Wt 155 lb 14.4 oz (70.7 kg)   BMI 25.16 kg/m  General:   Alert and oriented. No distress noted. Pleasant and cooperative.  Head:  Normocephalic and atraumatic. Eyes:  Conjuctiva clear without scleral icterus. Mouth:  Oral mucosa pink and moist. Good dentition. No lesions. Heart: Normal rate and rhythm, s1 and s2 heart sounds present.  Lungs: Clear lung sounds in all lobes. Respirations equal and unlabored. Abdomen:  +BS, soft, non-tender and non-distended. No rebound or guarding. No HSM or masses noted. Derm: No palmar erythema or jaundice Msk:  Symmetrical without gross deformities. Normal posture. Extremities:  Without edema. Neurologic:  Alert and  oriented x4 Psych:  Alert and cooperative. Normal mood and affect.  Invalid input(s): "6 MONTHS"   ASSESSMENT: Jamie Alexander is a 67 y.o. female presenting today for follow up of Dry heaves and with constipation  Dry  Heaves/GERD: history of dry heaves, no real GERD symptoms in the past, she was stared on omeprazole 40mg  daily after EGD and notes complete resolution of her dry heaves. She did have some intestinal metaplasia noted on recent EGD and will need repeat EGD again in 3 years. Will continue with omeprazole 40mg  daily.   Constipation: began after taking a muscle relaxer. Did have some relief when taking a dulcolax. Denies abdominal pain, rectal bleeding, melena or weight loss. Recommended to Start taking Miralax 1 capful every day for one week. If bowel movements do not improve, increase to 1 capful every 12 hours. If after two weeks there is no improvement, increase to 1 capful every 8 hours, as well as Increase water intake, aim for atleast 64 oz per day, Increase fruits, veggies and whole grains, kiwi and prunes are especially good for constipation. She should make me aware if constipation Is not improving with the above mentioned interventions.    PLAN:  Continued omeprazole 40mg  daily  2.  Start taking Miralax 1 capful every day for one week. If bowel movements do not improve, increase to 1 capful every 12 hours. If after two weeks there is no improvement, increase to 1 capful every 8 hours 3. Increase water intake, aim for atleast 64 oz per day Increase fruits, veggies and whole grains, kiwi and prunes are especially good for constipation   All questions were answered, patient verbalized understanding and is in agreement with plan as outlined above.   Follow Up: 6 months   Reality Dejonge L. Jeanmarie Hubert, MSN, APRN, AGNP-C Adult-Gerontology Nurse Practitioner [facility]  I have reviewed the note and agree with the APP's assessment as described in this progress note  Katrinka Blazing, MD Gastroenterology and Hepatology [facility]

## 2023-10-14 ENCOUNTER — Telehealth (INDEPENDENT_AMBULATORY_CARE_PROVIDER_SITE_OTHER): Payer: Self-pay | Admitting: *Deleted

## 2023-10-14 NOTE — Telephone Encounter (Signed)
Called patient and left on her vm per patient.  Per chelsea - It is safe for her to continue miralax if she needs it. Depending how much she is taking, she can take up to 1 capful every 8 hours. Needs to make sure water intake is good as well.  Pt advised to call back if any questions.

## 2023-10-14 NOTE — Telephone Encounter (Signed)
Pt states at her follow up visit on 9/26 she was told to start taking miralax for her constipation. Took for 7 days straight and got a little easier a couple of days but back to going every other day without going. She is concerned because the bottle of miralax says not to take for over 7 days. Not having any pain.    [phone]. May leave message if I dont get her when I called back.

## 2024-03-16 ENCOUNTER — Ambulatory Visit (INDEPENDENT_AMBULATORY_CARE_PROVIDER_SITE_OTHER): Payer: Medicare Other | Admitting: Gastroenterology

## 2024-03-16 ENCOUNTER — Encounter (INDEPENDENT_AMBULATORY_CARE_PROVIDER_SITE_OTHER): Payer: Self-pay | Admitting: Gastroenterology

## 2024-03-16 VITALS — BP 139/78 | HR 80 | Temp 97.5°F | Ht 66.0 in | Wt 153.1 lb

## 2024-03-16 DIAGNOSIS — K219 Gastro-esophageal reflux disease without esophagitis: Secondary | ICD-10-CM

## 2024-03-16 DIAGNOSIS — R059 Cough, unspecified: Secondary | ICD-10-CM | POA: Diagnosis not present

## 2024-03-16 DIAGNOSIS — K5900 Constipation, unspecified: Secondary | ICD-10-CM

## 2024-03-16 MED ORDER — FAMOTIDINE 20 MG PO TABS: 20.0000 mg | ORAL_TABLET | Freq: Every day | ORAL | 1 refills | Status: DC

## 2024-03-16 NOTE — Patient Instructions (Addendum)
-  continue omeprazole 40mg  daily -continue allergy medicine/cough medicine/honey at night -start pepcid 20mg  at bedtime, let me know how symptoms are doing in about 3-4 weeks  -consider pH impedence testing if not improving -continue metamucil daily and miralax every other day -Increase water intake, aim for atleast 64 oz per day -Increase fruits, veggies and whole grains, kiwi and prunes are especially good for constipation  Follow up 3 months  It was a pleasure to see you today. I want to create trusting relationships with patients and provide genuine, compassionate, and quality care. I truly value your feedback! please be on the lookout for a survey regarding your visit with me today. I appreciate your input about our visit and your time in completing this!    Cormac Wint L. Jeanmarie Hubert, MSN, APRN, AGNP-C Adult-Gerontology Nurse Practitioner [facility] Gastroenterology at [facility]

## 2024-03-16 NOTE — Progress Notes (Addendum)
 Referring Provider: Kirstie Peri, MD Primary Care Physician:  Kirstie Peri, MD Primary GI Physician: Dr. Levon Hedger   Chief Complaint  Patient presents with   Constipation    Follow up on constipation. Takes miralax on Mondays, Wednesdays, and Fridays. Tried to take daily and it was too much. States stool is different colors of brown and sometimes orange. States she has not seen any bleeding. Stool will be hard and then right after has some soft stool come out afterward.    Gastroesophageal Reflux    Follow up on GERD. Takes omeprazole. Has had a few episodes of cough, belching and some gagging. States concerns about dry mouth and throat and makes her cough.    HPI:   Jamie Alexander is a 68 y.o. female with past medical history of arthritis, HLD, GERD    Patient presenting today for:  Follow up of GERD/coughing and constipation  Last seen September 2024, at that time doing well with omeprazole 40mg  daily. No further dry heaving since starting this. Having some constipation that began after starting cyclobenzaprine. Taking dulcolax which helped some though noted stools were very hard.   Recommended to continue with omeprzole 40mg  daily, start miralax, increase water intake  Present: States since her last visit she has had a few episodes where she will start coughing and then start belching and cough up some clear liquids. She notes symptoms not as often as previously. These episodes can occur without her eating or drinking. She notes that she has a dry throat. She is trying to sip on water/have a throat lozenge in. She denies sore throat. She feels her tongue gets dry at times. She is not feeling any symptoms of heartburn or as if acid is coming up unless she is belching. She can wake up at night sometimes coughing. She is taking xyzal which she thinks is helping some. Also taking robitussin cough syrup which doesn't seem to control her cough. Denies any dysphagia or odynophagia. She  is taking omeprazole 40mg  daily.   Notably she has seen ENT in the past and told she had over sensitivity in her throat, was started on a "nerve" pill which she states she tried for 2 months and had no improvement.   She is still having some issues with harder stools, doing better on Miralax 1 capful Monday, Wednesday, Friday. Could not tolerate daily dosing as she had diarrhea. Having usually 2 BMs per day, occasionally will skip a day. Stools can range from harder to looser all in one sitting. No abdominal pain. No rectal bleeding or melena. She is doing 2 capsules of metamucil per day as well. Water intake is good, around 48-64 oz per day.   She has lost some weight over the past year but notes she has cut back on sodas and sugars as she was told her weight was up and glucose was up at her physical last year. She has recently started back on exercising and playing pickle ball recently as well.   Last Colonoscopy: 2021 - One small polyp in the proximal transverse colon,                            removed with a cold snare. Complete resection.                            Polyp tissue not retrieved.  comment: polyp appeared to be an adenoma.   Last Endoscopy:-06/2023 1 cm hiatal hernia.                           - Deformity in the gastric body.                           - Gastritis. Biopsied.                           - Normal examined duodenum. Biopsied. A. SMALL BOWEL, BIOPSY: - Benign small bowel mucosa with no significant pathologic changes   B. STOMACH, BIOPSY: - Chronic inactive gastritis with intestinal metaplasia and background reactive changes - Negative for H. pylori on HE stain - Negative for dysplasia or malignancy   Recommendations:  Repeat colonoscopy 2028 Repeat EGD in 3 years  Filed Weights   03/16/24 1012  Weight: 153 lb 1.6 oz (69.4 kg)     Past Medical History:  Diagnosis Date   Arthritis    Hyperlipidemia    Seasonal allergies      Past Surgical History:  Procedure Laterality Date   BIOPSY  05/30/2020   Procedure: BIOPSY;  Surgeon: Malissa Hippo, MD;  [facility];  Service: Endoscopy;;  gastric   BIOPSY  06/23/2023   Procedure: BIOPSY;  Surgeon: Dolores Frame, MD;  [facility];  Service: Gastroenterology;;   Arbutus Leas Left    COLONOSCOPY     COLONOSCOPY N/A 05/30/2020   Procedure: COLONOSCOPY;  Surgeon: Malissa Hippo, MD;  [facility];  Service: Endoscopy;  Laterality: N/A;  730   ESOPHAGOGASTRODUODENOSCOPY N/A 05/30/2020   Procedure: ESOPHAGOGASTRODUODENOSCOPY (EGD);  Surgeon: Malissa Hippo, MD;  [facility];  Service: Endoscopy;  Laterality: N/A;   ESOPHAGOGASTRODUODENOSCOPY (EGD) WITH PROPOFOL N/A 06/23/2023   Procedure: ESOPHAGOGASTRODUODENOSCOPY (EGD) WITH PROPOFOL;  Surgeon: Dolores Frame, MD;  [facility];  Service: Gastroenterology;  Laterality: N/A;  9:00am;asa 2   POLYPECTOMY  05/30/2020   Procedure: POLYPECTOMY;  Surgeon: Malissa Hippo, MD;  [facility];  Service: Endoscopy;;   TOTAL HIP ARTHROPLASTY Right 02/28/2019   Procedure: TOTAL HIP ARTHROPLASTY ANTERIOR APPROACH;  Surgeon: Durene Romans, MD;  [facility];  Service: Orthopedics;  Laterality: Right;    Current Outpatient Medications  Medication Sig Dispense Refill   acyclovir (ZOVIRAX) 800 MG tablet Take 800 mg by mouth daily as needed (cold sores).     aspirin EC 81 MG tablet Take 81 mg by mouth daily.     Cholecalciferol (VITAMIN D3) 125 MCG (5000 UT) CAPS Take 5,000 Units by mouth daily.     levocetirizine (XYZAL) 5 MG tablet Take 5 mg by mouth every evening. As needed     Multiple Vitamin (MULTIVITAMIN WITH MINERALS) TABS tablet Take 1 tablet by mouth daily.     Neomy-Bacit-Polymyx-Pramoxine (NEOSPORIN + PAIN/ITCH/SCAR) 1 % OINT Apply 1 application topically daily as needed (itching).     omeprazole (PRILOSEC) 40 MG capsule Take 1  capsule (40 mg total) by mouth daily. 90 capsule 3   polyethylene glycol powder (GLYCOLAX/MIRALAX) 17 GM/SCOOP powder Take by mouth once. One capful every Monday, Wednesday and Friday     Psyllium (METAMUCIL PO) Take 2 capsules by mouth daily.     rosuvastatin (CRESTOR) 20 MG tablet Take 20 mg by mouth every evening.  No current facility-administered medications for this visit.    Allergies as of 03/16/2024 - Review Complete 03/16/2024  Allergen Reaction Noted   Flexeril [cyclobenzaprine] Anaphylaxis 09/16/2023    Social History   Socioeconomic History   Marital status: Married    Spouse name: Not on file   Number of children: Not on file   Years of education: Not on file   Highest education level: Not on file  Occupational History   Not on file  Tobacco Use   Smoking status: Never    Passive exposure: Past   Smokeless tobacco: Never  Vaping Use   Vaping status: Never Used  Substance and Sexual Activity   Alcohol use: Not Currently    Comment: drinks 4 beer per day   Drug use: Never   Sexual activity: Not on file  Other Topics Concern   Not on file  Social History Narrative   Not on file   Social Drivers of Health   Financial Resource Strain: Not on file  Food Insecurity: Not on file  Transportation Needs: Not on file  Physical Activity: Not on file  Stress: Not on file  Social Connections: Not on file    Review of systems General: negative for malaise, night sweats, fever, chills, weight loss Neck: Negative for lumps, goiter, pain and significant neck swelling Resp: Negative for cough, wheezing, dyspnea at rest CV: Negative for chest pain, leg swelling, palpitations, orthopnea GI: denies melena, hematochezia, nausea, vomiting, dysphagia, odyonophagia, early satiety or unintentional weight loss. +harder to looser stools +belching/coughing The remainder of the review of systems is noncontributory.  Physical Exam: BP 139/78   Pulse 80   Temp (!) 97.5 F  (36.4 C)   Ht 5\' 6"  (1.676 m)   Wt 153 lb 1.6 oz (69.4 kg)   BMI 24.71 kg/m  General:   Alert and oriented. No distress noted. Pleasant and cooperative.  Head:  Normocephalic and atraumatic. Eyes:  Conjuctiva clear without scleral icterus. Mouth:  Oral mucosa pink and moist. Good dentition. No lesions. Heart: Normal rate and rhythm, s1 and s2 heart sounds present.  Lungs: Clear lung sounds in all lobes. Respirations equal and unlabored. Abdomen:  +BS, soft, non-tender and non-distended. No rebound or guarding. No HSM or masses noted. Neurologic:  Alert and  oriented x4 Psych:  Alert and cooperative. Normal mood and affect.  Invalid input(s): "6 MONTHS"   ASSESSMENT: Taylah Dubiel Therien is a 68 y.o. female presenting today for follow up of GERD/coughing and constipation   GERD/coughing: chronic, intermittent history of cough/dry heaves, she was started on omeprazole after EGD and had complete resolution of her symptoms for a while, though notes she is starting to have more symptoms again. Denies any heartburn or acid regurgitation, no sore throat. Endorses dryness to her throat. Symptoms not precipitated by eating or drinking therefore low suspicion of aspiration. She has seen ENT in the past and told she had oversensitivity in her throat though notes she was on a "nerve pill" for a while which did not seem to help. She does endorse issues with allergies/sinuses for which she is taking xyzal with some improvement. Suspect her symptoms could be multifactorial in setting of sinus/allergies, possible silent reflux and oversensitivity of esophagus. Would recommend continuing omeprazole 40mg  and starting pepcid 20mg  at bedtime. If she does not have improvement in her symptoms, would recommend Ph impedence testing off  PPI which I discussed with the patient to determine if she has true silent reflux vs.  Hypersensitivity. She will call with an updated in 3-4 weeks on how pepcid is doing for her.    Constipation:doing better with metamucil daily and miralax 3 times per week, could not tolerate daily dosing of miralax. No abdominal pain, rectal bleeding or melena. Water intake is good. For now would recommend continuing with current regimen, maintaining good water intake and high fiber diet.     PLAN:  -continue omeprazole 40mg  daily -good reflux precautions -continue allergy medicine -start pepcid 20mg  at bedtime -consider pH impedence testing off PPI if not improving  -continue metamucil daily and miralax every other day -Increase water intake, aim for atleast 64 oz per day -Increase fruits, veggies and whole grains, kiwi and prunes are especially good for constipation  All questions were answered, patient verbalized understanding and is in agreement with plan as outlined above.   Follow Up: 3 months   Albertina Leise L. Jeanmarie Hubert, MSN, APRN, AGNP-C Adult-Gerontology Nurse Practitioner [facility]  I have reviewed the note and agree with the APP's assessment as described in this progress note  Katrinka Blazing, MD Gastroenterology and Hepatology [facility]

## 2024-04-07 ENCOUNTER — Other Ambulatory Visit (INDEPENDENT_AMBULATORY_CARE_PROVIDER_SITE_OTHER): Payer: Self-pay | Admitting: Gastroenterology

## 2024-05-23 ENCOUNTER — Encounter (INDEPENDENT_AMBULATORY_CARE_PROVIDER_SITE_OTHER): Payer: Self-pay | Admitting: Gastroenterology

## 2024-05-23 ENCOUNTER — Ambulatory Visit (INDEPENDENT_AMBULATORY_CARE_PROVIDER_SITE_OTHER): Admitting: Gastroenterology

## 2024-05-23 VITALS — BP 138/83 | HR 71 | Temp 98.1°F | Ht 67.0 in | Wt 155.0 lb

## 2024-05-23 DIAGNOSIS — K219 Gastro-esophageal reflux disease without esophagitis: Secondary | ICD-10-CM

## 2024-05-23 DIAGNOSIS — K5900 Constipation, unspecified: Secondary | ICD-10-CM | POA: Diagnosis not present

## 2024-05-23 DIAGNOSIS — R059 Cough, unspecified: Secondary | ICD-10-CM

## 2024-05-23 NOTE — Patient Instructions (Signed)
 Please continue omeprazole  40mg  daily Continue pepcid  20mg  nightly Avoid greasy, spicy, fried, citrus foods, and be mindful that caffeine, carbonated drinks, chocolate and alcohol  can increase reflux symptoms Stay upright 2-3 hours after eating, prior to lying down and avoid eating late in the evenings. Increase water  intake, aim for atleast 64 oz per day Increase fruits, veggies and whole grains, kiwi and prunes are especially good for constipation Continue miralax  and metamucil as you are doing for constipation  Follow up 1 year  It was a pleasure to see you today. I want to create trusting relationships with patients and provide genuine, compassionate, and quality care. I truly value your feedback! please be on the lookout for a survey regarding your visit with me today. I appreciate your input about our visit and your time in completing this!    Jamie Alexander L. Jamie Jane, MSN, APRN, AGNP-C Adult-Gerontology Nurse Practitioner [facility] at [facility]

## 2024-05-23 NOTE — Progress Notes (Addendum)
 Referring Provider: Theoplis Fix, MD Primary Care Physician:  Theoplis Fix, MD Primary GI Physician: Dr. Sammi Crick   Chief Complaint  Patient presents with   Follow-up    Doing better after adding Pepcid  at night.   HPI:   Jamie Alexander is a 68 y.o. female with past medical history of arthritis, HLD, GERD    Patient presenting today for:  Follow up of GERD/Coughing and constipation  Last seen march 2025, at that time still with some coughing, belching, though less frequent. No dysphagia, taking prilosec 40mg  daily. Saw ENT in the past and told she had oversensitive throat  Recommended continue omeprazole  40mg  daily, start pepcid  20mg  at bedtime, consider ph impedence off PPI if symptoms persists, metamucil every day, miralax  every other day.   Present:  Doing better since adding pepcid  20mg  and omeprazole  40mg  in the morning. Feels coughing has resolved. No further gagging episodes.  Denies heartburn or acid regurgitation. Denies dysphagia or odynophagia   Constipation is well managed with miralax  once weekly, she states she had titrated miralax  down as she was having more diarrhea, feels stools are soft but formed now, having a BM 1-2 times per day. Taking metamucil daily. No rectal bleeding or melena.   Last Colonoscopy: 2021 - One small polyp in the proximal transverse colon,                            removed with a cold snare. Complete resection.                            Polyp tissue not retrieved.                           comment: polyp appeared to be an adenoma.   Last Endoscopy:-06/2023 1 cm hiatal hernia.                           - Deformity in the gastric body.                           - Gastritis. Biopsied.                           - Normal examined duodenum. Biopsied. A. SMALL BOWEL, BIOPSY: - Benign small bowel mucosa with no significant pathologic changes   B. STOMACH, BIOPSY: - Chronic inactive gastritis with intestinal metaplasia and  background reactive changes - Negative for H. pylori on HE stain - Negative for dysplasia or malignancy   Recommendations:  Repeat colonoscopy 2028 Repeat EGD in 3 years  Filed Weights   05/23/24 1015  Weight: 155 lb (70.3 kg)     Past Medical History:  Diagnosis Date   Arthritis    Hyperlipidemia    Seasonal allergies     Past Surgical History:  Procedure Laterality Date   BIOPSY  05/30/2020   Procedure: BIOPSY;  Surgeon: Ruby Corporal, MD;  [facility];  Service: Endoscopy;;  gastric   BIOPSY  06/23/2023   Procedure: BIOPSY;  Surgeon: Urban Garden, MD;  [facility];  Service: Gastroenterology;;   Tillman Folks Left    COLONOSCOPY     COLONOSCOPY N/A 05/30/2020   Procedure: COLONOSCOPY;  Surgeon: Ruby Corporal,  MD;  [facility];  Service: Endoscopy;  Laterality: N/A;  730   ESOPHAGOGASTRODUODENOSCOPY N/A 05/30/2020   Procedure: ESOPHAGOGASTRODUODENOSCOPY (EGD);  Surgeon: Ruby Corporal, MD;  [facility];  Service: Endoscopy;  Laterality: N/A;   ESOPHAGOGASTRODUODENOSCOPY (EGD) WITH PROPOFOL  N/A 06/23/2023   Procedure: ESOPHAGOGASTRODUODENOSCOPY (EGD) WITH PROPOFOL ;  Surgeon: Urban Garden, MD;  [facility];  Service: Gastroenterology;  Laterality: N/A;  9:00am;asa 2   POLYPECTOMY  05/30/2020   Procedure: POLYPECTOMY;  Surgeon: Ruby Corporal, MD;  [facility];  Service: Endoscopy;;   TOTAL HIP ARTHROPLASTY Right 02/28/2019   Procedure: TOTAL HIP ARTHROPLASTY ANTERIOR APPROACH;  Surgeon: Claiborne Crew, MD;  [facility];  Service: Orthopedics;  Laterality: Right;    Current Outpatient Medications  Medication Sig Dispense Refill   acyclovir (ZOVIRAX) 800 MG tablet Take 800 mg by mouth daily as needed (cold sores).     aspirin  EC 81 MG tablet Take 81 mg by mouth daily.     Cholecalciferol (VITAMIN D3) 125 MCG (5000 UT) CAPS Take 5,000 Units by mouth daily.     famotidine   (PEPCID ) 20 MG tablet TAKE 1 TABLET BY MOUTH EVERYDAY AT BEDTIME 90 tablet 1   fexofenadine (ALLEGRA) 180 MG tablet Take 180 mg by mouth daily.     levocetirizine (XYZAL) 5 MG tablet Take 5 mg by mouth every evening. As needed     Multiple Vitamin (MULTIVITAMIN WITH MINERALS) TABS tablet Take 1 tablet by mouth daily.     Neomy-Bacit-Polymyx-Pramoxine (NEOSPORIN + PAIN/ITCH/SCAR) 1 % OINT Apply 1 application topically daily as needed (itching).     omeprazole  (PRILOSEC) 40 MG capsule Take 1 capsule (40 mg total) by mouth daily. 90 capsule 3   polyethylene glycol powder (GLYCOLAX /MIRALAX ) 17 GM/SCOOP powder Take by mouth once. One capful every Monday, Wednesday and Friday     Psyllium (METAMUCIL PO) Take 2 capsules by mouth daily.     rosuvastatin (CRESTOR) 20 MG tablet Take 20 mg by mouth every evening.     No current facility-administered medications for this visit.    Allergies as of 05/23/2024 - Review Complete 05/23/2024  Allergen Reaction Noted   Flexeril [cyclobenzaprine] Anaphylaxis 09/16/2023    Social History   Socioeconomic History   Marital status: Married    Spouse name: Not on file   Number of children: Not on file   Years of education: Not on file   Highest education level: Not on file  Occupational History   Not on file  Tobacco Use   Smoking status: Never    Passive exposure: Past   Smokeless tobacco: Never  Vaping Use   Vaping status: Never Used  Substance and Sexual Activity   Alcohol  use: Not Currently    Comment: drinks 4 beer per day   Drug use: Never   Sexual activity: Not on file  Other Topics Concern   Not on file  Social History Narrative   Not on file   Social Drivers of Health   Financial Resource Strain: Not on file  Food Insecurity: Not on file  Transportation Needs: Not on file  Physical Activity: Not on file  Stress: Not on file  Social Connections: Not on file    Review of systems General: negative for malaise, night sweats, fever,  chills, weight loss Neck: Negative for lumps, goiter, pain and significant neck swelling Resp: Negative for cough, wheezing, dyspnea at rest CV: Negative for chest pain, leg swelling, palpitations, orthopnea GI:  denies melena, hematochezia, nausea, vomiting, diarrhea, constipation, dysphagia, odyonophagia, early satiety or unintentional weight loss.  The remainder of the review of systems is noncontributory.  Physical Exam: BP 138/83 (BP Location: Right Arm, Patient Position: Sitting, Cuff Size: Normal)   Pulse 71   Temp 98.1 F (36.7 C) (Oral)   Ht 5\' 7"  (1.702 m)   Wt 155 lb (70.3 kg)   SpO2 97%   BMI 24.28 kg/m  General:   Alert and oriented. No distress noted. Pleasant and cooperative.  Head:  Normocephalic and atraumatic. Eyes:  Conjuctiva clear without scleral icterus. Mouth:  Oral mucosa pink and moist. Good dentition. No lesions. Heart: Normal rate and rhythm, s1 and s2 heart sounds present.  Lungs: Clear lung sounds in all lobes. Respirations equal and unlabored. Abdomen:  +BS, soft, non-tender and non-distended. No rebound or guarding. No HSM or masses noted. Neurologic:  Alert and  oriented x4 Psych:  Alert and cooperative. Normal mood and affect.  Invalid input(s): "6 MONTHS"   ASSESSMENT: Jamie Alexander is a 68 y.o. female presenting today for follow up of GERD, coughing, constipation  GERD/Coughing: having atypical GERD symptoms previously with coughing/dry heaving which has resolved with addition of pepcid  20mg  at bedtime along with her omeprazole  40mg  daily. Feels GERD is well controlled. Will continue with current GERD regimen and good reflux precautions  Constipation: doing well on metamucil daily, miralax  once per week, having a BM 1-2 times per day with solid but soft stools.    PLAN:  -continue omeprazole  40mg  daily -continue pepcid  20mg  at bedtime -continue good reflux precautions -continue miralax  weekly, metamucil daily -Increase water   intake, aim for atleast 64 oz per day -Increase fruits, veggies and whole grains, kiwi and prunes are especially good for constipation  All questions were answered, patient verbalized understanding and is in agreement with plan as outlined above.    Follow Up: 1 year   Zabian Swayne L. Adrien Alberta, MSN, APRN, AGNP-C Adult-Gerontology Nurse Practitioner [facility]  I have reviewed the note and agree with the APP's assessment as described in this progress note  Samantha Cress, MD Gastroenterology and Hepatology [facility]

## 2024-06-10 ENCOUNTER — Other Ambulatory Visit (INDEPENDENT_AMBULATORY_CARE_PROVIDER_SITE_OTHER): Payer: Self-pay | Admitting: Gastroenterology

## 2024-06-20 DIAGNOSIS — Z79899 Other long term (current) drug therapy: Secondary | ICD-10-CM | POA: Diagnosis not present

## 2024-06-20 DIAGNOSIS — Z1389 Encounter for screening for other disorder: Secondary | ICD-10-CM | POA: Diagnosis not present

## 2024-06-20 DIAGNOSIS — Z299 Encounter for prophylactic measures, unspecified: Secondary | ICD-10-CM | POA: Diagnosis not present

## 2024-06-20 DIAGNOSIS — R5383 Other fatigue: Secondary | ICD-10-CM | POA: Diagnosis not present

## 2024-06-20 DIAGNOSIS — K146 Glossodynia: Secondary | ICD-10-CM | POA: Diagnosis not present

## 2024-06-20 DIAGNOSIS — E7800 Pure hypercholesterolemia, unspecified: Secondary | ICD-10-CM | POA: Diagnosis not present

## 2024-06-20 DIAGNOSIS — Z7189 Other specified counseling: Secondary | ICD-10-CM | POA: Diagnosis not present

## 2024-06-20 DIAGNOSIS — Z0000 Encounter for general adult medical examination without abnormal findings: Secondary | ICD-10-CM | POA: Diagnosis not present

## 2024-07-10 DIAGNOSIS — H40012 Open angle with borderline findings, low risk, left eye: Secondary | ICD-10-CM | POA: Diagnosis not present

## 2024-08-15 ENCOUNTER — Other Ambulatory Visit: Payer: Self-pay | Admitting: Internal Medicine

## 2024-08-15 DIAGNOSIS — Z299 Encounter for prophylactic measures, unspecified: Secondary | ICD-10-CM | POA: Diagnosis not present

## 2024-08-15 DIAGNOSIS — L821 Other seborrheic keratosis: Secondary | ICD-10-CM | POA: Diagnosis not present

## 2024-08-15 DIAGNOSIS — R52 Pain, unspecified: Secondary | ICD-10-CM | POA: Diagnosis not present

## 2024-08-15 DIAGNOSIS — Z1231 Encounter for screening mammogram for malignant neoplasm of breast: Secondary | ICD-10-CM

## 2024-08-15 DIAGNOSIS — I10 Essential (primary) hypertension: Secondary | ICD-10-CM | POA: Diagnosis not present

## 2024-08-15 DIAGNOSIS — M10071 Idiopathic gout, right ankle and foot: Secondary | ICD-10-CM | POA: Diagnosis not present

## 2024-08-22 ENCOUNTER — Ambulatory Visit
Admission: RE | Admit: 2024-08-22 | Discharge: 2024-08-22 | Disposition: A | Discharge status: Home / Self Care | Source: Ambulatory Visit | Attending: Internal Medicine | Admitting: Internal Medicine

## 2024-08-22 DIAGNOSIS — Z1231 Encounter for screening mammogram for malignant neoplasm of breast: Secondary | ICD-10-CM

## 2024-09-07 ENCOUNTER — Telehealth (INDEPENDENT_AMBULATORY_CARE_PROVIDER_SITE_OTHER): Payer: Self-pay

## 2024-09-07 ENCOUNTER — Encounter (INDEPENDENT_AMBULATORY_CARE_PROVIDER_SITE_OTHER): Payer: Self-pay | Admitting: Gastroenterology

## 2024-09-07 ENCOUNTER — Ambulatory Visit (INDEPENDENT_AMBULATORY_CARE_PROVIDER_SITE_OTHER): Admitting: Gastroenterology

## 2024-09-07 VITALS — BP 105/73 | HR 105 | Temp 97.0°F | Ht 66.0 in | Wt 150.4 lb

## 2024-09-07 DIAGNOSIS — K625 Hemorrhage of anus and rectum: Secondary | ICD-10-CM | POA: Diagnosis not present

## 2024-09-07 MED ORDER — PEG 3350-KCL-NA BICARB-NACL 420 G PO SOLR: 4000.0000 mL | Freq: Once | ORAL | 0 refills | Status: AC

## 2024-09-07 NOTE — Patient Instructions (Signed)
 We will get you scheduled for colonoscopy Please let me know if you develop abdominal pain, worsening bleeding or diarrhea  Follow up 3 months  It was a pleasure to see you today. I want to create trusting relationships with patients and provide genuine, compassionate, and quality care. I truly value your feedback! please be on the lookout for a survey regarding your visit with me today. I appreciate your input about our visit and your time in completing this!    Sayaka Hoeppner L. Freya Zobrist, MSN, APRN, AGNP-C Adult-Gerontology Nurse Practitioner [facility]enter[facility] at [facility]

## 2024-09-07 NOTE — Telephone Encounter (Signed)
 Spoke with patient in person, scheduled colonoscopy for 09/12/2024 at 11:15am. Rx sent to pharmacy. Instructions given to patient.

## 2024-09-07 NOTE — H&P (View-Only) (Signed)
 Referring Provider: Maree Isles, MD Primary Care Physician:  Maree Isles, MD Primary GI Physician: Dr. Eartha   Chief Complaint  Patient presents with   Rectal Bleeding    Pt arrives due to rectal bleeding. Began about 2 weeks ago, couple times of day. Pt did have blood when passing gas. Also had some in stool. Bright red blood. Monday pt had diarrhea, Tuesday and Wednesday no BM, had BM this morning. Taking Miralax  prn. Usually has one or more Bms daily.    HPI:   Jamie Alexander is a 68 y.o. female with past medical history of arthritis, HLD, GERD    Patient presenting today for:  Rectal bleeding    Last seen march jjune, at that time Doing better since adding pepcid  20mg  and omeprazole  40mg  in the morning. Feels coughing has resolved. No further gagging episodes. Denies heartburn or acid regurgitation. Denies dysphagia or odynophagia   Constipation is well managed with miralax  once weekly, having a BM 1-2 times per day. Taking metamucil daily.   Recommended continue omeprazole  40mg  daily, continue pepcid  20mg  at bedtime, continue good reflux precautions, continue miralax  weekly, metamucil daily, increase water  intake, aim for atleast 64 oz per day, Increase fruits, veggies and whole grains, kiwi and prunes are especially good for constipation  Present: Reports that 2 weeks ago she woke up one morning and went to have a BM, she passed flatus and noted some BRB passed as well. Throughout the rest of the day she passed more gas and noted passage of blood continued though in smaller volume than initially that morning. She states that symptoms persisted thur, fri, sat and sun. On mon, tue wed she did not have any symptoms and then had recurrence on Thursday that lasted through the weekend. She notes that she had a few episodes where she saw blood in the water  after she had a BM. Monday she had diarrhea (watery stools, up to 6 BMs that day). Denies any abdominal pain. She took  miralax  on wedneday morning as she had no BMs on Tuesday. This morning she had a larger, formed stool without blood. She notes stool this morning was darker in color. She denies any rectal pain, itching, burning. No nausea or vomiting. Denies any new medications or recent antibiotics. Denies any NSAID use   Last labs in July with hgb 13.8 TSH 3.93   CT A/P with contrast: 07/2023 No acute abdominal or pelvic pathology.  Last Colonoscopy: 2021 - One small polyp in the proximal transverse colon,                            removed with a cold snare. Complete resection.                            Polyp tissue not retrieved.                           comment: polyp appeared to be an adenoma.   Last Endoscopy:-06/2023 1 cm hiatal hernia.                           - Deformity in the gastric body.                           - Gastritis.  Biopsied.                           - Normal examined duodenum. Biopsied. A. SMALL BOWEL, BIOPSY: - Benign small bowel mucosa with no significant pathologic changes   B. STOMACH, BIOPSY: - Chronic inactive gastritis with intestinal metaplasia and background reactive changes - Negative for H. pylori on HE stain - Negative for dysplasia or malignancy   Recommendations:  Repeat colonoscopy 2028 Repeat EGD in 3 years    Filed Weights   09/07/24 0846  Weight: 150 lb 6.4 oz (68.2 kg)     Past Medical History:  Diagnosis Date   Arthritis    Hyperlipidemia    Seasonal allergies     Past Surgical History:  Procedure Laterality Date   BIOPSY  05/30/2020   Procedure: BIOPSY;  Surgeon: Golda Claudis PENNER, MD;  [facility];  Service: Endoscopy;;  gastric   BIOPSY  06/23/2023   Procedure: BIOPSY;  Surgeon: Eartha Angelia Sieving, MD;  [facility];  Service: Gastroenterology;;   ROMAYNE Left    COLONOSCOPY     COLONOSCOPY N/A 05/30/2020   Procedure: COLONOSCOPY;  Surgeon: Golda Claudis PENNER, MD;  [facility];  Service:  Endoscopy;  Laterality: N/A;  730   ESOPHAGOGASTRODUODENOSCOPY N/A 05/30/2020   Procedure: ESOPHAGOGASTRODUODENOSCOPY (EGD);  Surgeon: Golda Claudis PENNER, MD;  [facility];  Service: Endoscopy;  Laterality: N/A;   ESOPHAGOGASTRODUODENOSCOPY (EGD) WITH PROPOFOL  N/A 06/23/2023   Procedure: ESOPHAGOGASTRODUODENOSCOPY (EGD) WITH PROPOFOL ;  Surgeon: Eartha Angelia Sieving, MD;  [facility];  Service: Gastroenterology;  Laterality: N/A;  9:00am;asa 2   POLYPECTOMY  05/30/2020   Procedure: POLYPECTOMY;  Surgeon: Golda Claudis PENNER, MD;  [facility];  Service: Endoscopy;;   TOTAL HIP ARTHROPLASTY Right 02/28/2019   Procedure: TOTAL HIP ARTHROPLASTY ANTERIOR APPROACH;  Surgeon: Ernie Cough, MD;  [facility];  Service: Orthopedics;  Laterality: Right;    Current Outpatient Medications  Medication Sig Dispense Refill   acyclovir (ZOVIRAX) 800 MG tablet Take 800 mg by mouth daily as needed (cold sores).     aspirin  EC 81 MG tablet Take 81 mg by mouth daily.     Cholecalciferol (VITAMIN D3) 125 MCG (5000 UT) CAPS Take 5,000 Units by mouth daily.     famotidine  (PEPCID ) 20 MG tablet TAKE 1 TABLET BY MOUTH EVERYDAY AT BEDTIME 90 tablet 1   fexofenadine (ALLEGRA) 180 MG tablet Take 180 mg by mouth daily.     levocetirizine (XYZAL) 5 MG tablet Take 5 mg by mouth every evening. As needed     Multiple Vitamin (MULTIVITAMIN WITH MINERALS) TABS tablet Take 1 tablet by mouth daily.     Neomy-Bacit-Polymyx-Pramoxine (NEOSPORIN + PAIN/ITCH/SCAR) 1 % OINT Apply 1 application topically daily as needed (itching).     omeprazole  (PRILOSEC) 40 MG capsule TAKE 1 CAPSULE (40 MG TOTAL) BY MOUTH DAILY. 90 capsule 3   polyethylene glycol powder (GLYCOLAX /MIRALAX ) 17 GM/SCOOP powder Take by mouth once. One capful every Monday, Wednesday and Friday     Psyllium (METAMUCIL PO) Take 2 capsules by mouth daily.     rosuvastatin (CRESTOR) 20 MG tablet Take 20 mg by mouth every evening.     No  current facility-administered medications for this visit.    Allergies as of 09/07/2024 - Review Complete 09/07/2024  Allergen Reaction Noted   Flexeril [cyclobenzaprine] Anaphylaxis 09/16/2023    Social History   Socioeconomic History  Marital status: Married    Spouse name: Not on file   Number of children: Not on file   Years of education: Not on file   Highest education level: Not on file  Occupational History   Not on file  Tobacco Use   Smoking status: Never    Passive exposure: Past   Smokeless tobacco: Never  Vaping Use   Vaping status: Never Used  Substance and Sexual Activity   Alcohol  use: Not Currently    Comment: drinks 4 beer per day   Drug use: Never   Sexual activity: Not on file  Other Topics Concern   Not on file  Social History Narrative   Not on file   Social Drivers of Health   Financial Resource Strain: Not on file  Food Insecurity: Not on file  Transportation Needs: Not on file  Physical Activity: Not on file  Stress: Not on file  Social Connections: Not on file    Review of systems General: negative for malaise, night sweats, fever, chills, weight loss Neck: Negative for lumps, goiter, pain and significant neck swelling Resp: Negative for cough, wheezing, dyspnea at rest CV: Negative for chest pain, leg swelling, palpitations, orthopnea GI: denies melena, nausea, vomiting, constipation, dysphagia, odyonophagia, early satiety or unintentional weight loss. +diarrhea (now resolved) +rectal bleeding  MSK: Negative for joint pain or swelling, back pain, and muscle pain. Derm: Negative for itching or rash Psych: Denies depression, anxiety, memory loss, confusion. No homicidal or suicidal ideation.  Heme: Negative for prolonged bleeding, bruising easily, and swollen nodes. Endocrine: Negative for cold or heat intolerance, polyuria, polydipsia and goiter. Neuro: negative for tremor, gait imbalance, syncope and seizures. The remainder of the  review of systems is noncontributory.  Physical Exam: BP 105/73   Pulse (!) 105   Temp (!) 97 F (36.1 C)   Ht 5' 6 (1.676 m)   Wt 150 lb 6.4 oz (68.2 kg)   BMI 24.28 kg/m  General:   Alert and oriented. No distress noted. Pleasant and cooperative.  Head:  Normocephalic and atraumatic. Eyes:  Conjuctiva clear without scleral icterus. Mouth:  Oral mucosa pink and moist. Good dentition. No lesions. Heart: Normal rate and rhythm, s1 and s2 heart sounds present.  Lungs: Clear lung sounds in all lobes. Respirations equal and unlabored. Abdomen:  +BS, soft, non-tender and non-distended. No rebound or guarding. No HSM or masses noted. Rectal: glenys bruns LPN present as witness, no obvious lesions, masses present. No blood noted on exam, DRE with normal sphincter tone, tolerated well by the patient.  Derm: No palmar erythema or jaundice Msk:  Symmetrical without gross deformities. Normal posture. Extremities:  Without edema. Neurologic:  Alert and  oriented x4 Psych:  Alert and cooperative. Normal mood and affect.  Invalid input(s): 6 MONTHS   ASSESSMENT: Jamie Alexander is a 68 y.o. female presenting today for new onset rectal bleeding  New onset painless rectal bleeding, notes constipation has been mostly under control. Had about 6 episodes of diarrhea after symptoms began but this has resolved. Last BM today without Blood. Denies significant straining or constipation leading up to episodes of bleeding. Imaging of the abdomen in August 2024 was unremarkable and last TCS in 2021 with one small polyp in transverse colon. She does have history of hemorrhoids, on rectal exam today without any obvious hemorrhoids or other sources of bleeding. At this time, differentials include bleeding hemorrhoids, bleeding polyps, AVMs in the colon, diverticular bleed, cannot rule out malignancy  though less likely. Given last TCS was 4 years ago, would recommend updating colonoscopy for further  evaluation. Indications, risks and benefits of procedure discussed in detail with patient. Patient verbalized understanding and is in agreement to proceed with colonoscopy    PLAN:  -continue to use miralax  as needed -avoid straining, limit toileting time -schedule Colonoscopy ASA II  -pt to make me aware of worsening rectal bleeding, recurrence of diarrhea or onset of abdominal pain, would need to obtain stool studies for diarrhea, abdominal imaging if abdominal pain occurs   All questions were answered, patient verbalized understanding and is in agreement with plan as outlined above.   Follow Up: 3 months   Tung Pustejovsky L. Mariette, MSN, APRN, AGNP-C Adult-Gerontology Nurse Practitioner [facility] for [facility]  I have reviewed the note and agree with the APP's assessment as described in this progress note  Toribio Fortune, MD Gastroenterology and Hepatology [facility]

## 2024-09-07 NOTE — Telephone Encounter (Signed)
 Pt called in stating she has not received a pre-op phone call today. I advised her she is scheduled for a pre-op phone call tomorrow 9/19. She voiced understanding.

## 2024-09-07 NOTE — Progress Notes (Addendum)
 Referring Provider: Maree Isles, MD Primary Care Physician:  Maree Isles, MD Primary GI Physician: Dr. Eartha   Chief Complaint  Patient presents with   Rectal Bleeding    Pt arrives due to rectal bleeding. Began about 2 weeks ago, couple times of day. Pt did have blood when passing gas. Also had some in stool. Bright red blood. Monday pt had diarrhea, Tuesday and Wednesday no BM, had BM this morning. Taking Miralax  prn. Usually has one or more Bms daily.    HPI:   Jamie Alexander is a 68 y.o. female with past medical history of arthritis, HLD, GERD    Patient presenting today for:  Rectal bleeding    Last seen march jjune, at that time Doing better since adding pepcid  20mg  and omeprazole  40mg  in the morning. Feels coughing has resolved. No further gagging episodes. Denies heartburn or acid regurgitation. Denies dysphagia or odynophagia   Constipation is well managed with miralax  once weekly, having a BM 1-2 times per day. Taking metamucil daily.   Recommended continue omeprazole  40mg  daily, continue pepcid  20mg  at bedtime, continue good reflux precautions, continue miralax  weekly, metamucil daily, increase water  intake, aim for atleast 64 oz per day, Increase fruits, veggies and whole grains, kiwi and prunes are especially good for constipation  Present: Reports that 2 weeks ago she woke up one morning and went to have a BM, she passed flatus and noted some BRB passed as well. Throughout the rest of the day she passed more gas and noted passage of blood continued though in smaller volume than initially that morning. She states that symptoms persisted thur, fri, sat and sun. On mon, tue wed she did not have any symptoms and then had recurrence on Thursday that lasted through the weekend. She notes that she had a few episodes where she saw blood in the water  after she had a BM. Monday she had diarrhea (watery stools, up to 6 BMs that day). Denies any abdominal pain. She took  miralax  on wedneday morning as she had no BMs on Tuesday. This morning she had a larger, formed stool without blood. She notes stool this morning was darker in color. She denies any rectal pain, itching, burning. No nausea or vomiting. Denies any new medications or recent antibiotics. Denies any NSAID use   Last labs in July with hgb 13.8 TSH 3.93   CT A/P with contrast: 07/2023 No acute abdominal or pelvic pathology.  Last Colonoscopy: 2021 - One small polyp in the proximal transverse colon,                            removed with a cold snare. Complete resection.                            Polyp tissue not retrieved.                           comment: polyp appeared to be an adenoma.   Last Endoscopy:-06/2023 1 cm hiatal hernia.                           - Deformity in the gastric body.                           - Gastritis.  Biopsied.                           - Normal examined duodenum. Biopsied. A. SMALL BOWEL, BIOPSY: - Benign small bowel mucosa with no significant pathologic changes   B. STOMACH, BIOPSY: - Chronic inactive gastritis with intestinal metaplasia and background reactive changes - Negative for H. pylori on HE stain - Negative for dysplasia or malignancy   Recommendations:  Repeat colonoscopy 2028 Repeat EGD in 3 years    Filed Weights   09/07/24 0846  Weight: 150 lb 6.4 oz (68.2 kg)     Past Medical History:  Diagnosis Date   Arthritis    Hyperlipidemia    Seasonal allergies     Past Surgical History:  Procedure Laterality Date   BIOPSY  05/30/2020   Procedure: BIOPSY;  Surgeon: Golda Claudis PENNER, MD;  Location: [facility];  Service: Endoscopy;;  gastric   BIOPSY  06/23/2023   Procedure: BIOPSY;  Surgeon: Eartha Angelia Sieving, MD;  Location: [facility];  Service: Gastroenterology;;   ROMAYNE Left    COLONOSCOPY     COLONOSCOPY N/A 05/30/2020   Procedure: COLONOSCOPY;  Surgeon: Golda Claudis PENNER, MD;  Location: [facility];  Service:  Endoscopy;  Laterality: N/A;  730   ESOPHAGOGASTRODUODENOSCOPY N/A 05/30/2020   Procedure: ESOPHAGOGASTRODUODENOSCOPY (EGD);  Surgeon: Golda Claudis PENNER, MD;  Location: [facility];  Service: Endoscopy;  Laterality: N/A;   ESOPHAGOGASTRODUODENOSCOPY (EGD) WITH PROPOFOL  N/A 06/23/2023   Procedure: ESOPHAGOGASTRODUODENOSCOPY (EGD) WITH PROPOFOL ;  Surgeon: Eartha Angelia Sieving, MD;  Location: [facility];  Service: Gastroenterology;  Laterality: N/A;  9:00am;asa 2   POLYPECTOMY  05/30/2020   Procedure: POLYPECTOMY;  Surgeon: Golda Claudis PENNER, MD;  Location: [facility];  Service: Endoscopy;;   TOTAL HIP ARTHROPLASTY Right 02/28/2019   Procedure: TOTAL HIP ARTHROPLASTY ANTERIOR APPROACH;  Surgeon: Ernie Cough, MD;  Location: [facility];  Service: Orthopedics;  Laterality: Right;    Current Outpatient Medications  Medication Sig Dispense Refill   acyclovir (ZOVIRAX) 800 MG tablet Take 800 mg by mouth daily as needed (cold sores).     aspirin  EC 81 MG tablet Take 81 mg by mouth daily.     Cholecalciferol (VITAMIN D3) 125 MCG (5000 UT) CAPS Take 5,000 Units by mouth daily.     famotidine  (PEPCID ) 20 MG tablet TAKE 1 TABLET BY MOUTH EVERYDAY AT BEDTIME 90 tablet 1   fexofenadine (ALLEGRA) 180 MG tablet Take 180 mg by mouth daily.     levocetirizine (XYZAL) 5 MG tablet Take 5 mg by mouth every evening. As needed     Multiple Vitamin (MULTIVITAMIN WITH MINERALS) TABS tablet Take 1 tablet by mouth daily.     Neomy-Bacit-Polymyx-Pramoxine (NEOSPORIN + PAIN/ITCH/SCAR) 1 % OINT Apply 1 application topically daily as needed (itching).     omeprazole  (PRILOSEC) 40 MG capsule TAKE 1 CAPSULE (40 MG TOTAL) BY MOUTH DAILY. 90 capsule 3   polyethylene glycol powder (GLYCOLAX /MIRALAX ) 17 GM/SCOOP powder Take by mouth once. One capful every Monday, Wednesday and Friday     Psyllium (METAMUCIL PO) Take 2 capsules by mouth daily.     rosuvastatin (CRESTOR) 20 MG tablet Take 20 mg by mouth every evening.     No  current facility-administered medications for this visit.    Allergies as of 09/07/2024 - Review Complete 09/07/2024  Allergen Reaction Noted   Flexeril [cyclobenzaprine] Anaphylaxis 09/16/2023    Social History   Socioeconomic History  Marital status: Married    Spouse name: Not on file   Number of children: Not on file   Years of education: Not on file   Highest education level: Not on file  Occupational History   Not on file  Tobacco Use   Smoking status: Never    Passive exposure: Past   Smokeless tobacco: Never  Vaping Use   Vaping status: Never Used  Substance and Sexual Activity   Alcohol  use: Not Currently    Comment: drinks 4 beer per day   Drug use: Never   Sexual activity: Not on file  Other Topics Concern   Not on file  Social History Narrative   Not on file   Social Drivers of Health   Financial Resource Strain: Not on file  Food Insecurity: Not on file  Transportation Needs: Not on file  Physical Activity: Not on file  Stress: Not on file  Social Connections: Not on file    Review of systems General: negative for malaise, night sweats, fever, chills, weight loss Neck: Negative for lumps, goiter, pain and significant neck swelling Resp: Negative for cough, wheezing, dyspnea at rest CV: Negative for chest pain, leg swelling, palpitations, orthopnea GI: denies melena, nausea, vomiting, constipation, dysphagia, odyonophagia, early satiety or unintentional weight loss. +diarrhea (now resolved) +rectal bleeding  MSK: Negative for joint pain or swelling, back pain, and muscle pain. Derm: Negative for itching or rash Psych: Denies depression, anxiety, memory loss, confusion. No homicidal or suicidal ideation.  Heme: Negative for prolonged bleeding, bruising easily, and swollen nodes. Endocrine: Negative for cold or heat intolerance, polyuria, polydipsia and goiter. Neuro: negative for tremor, gait imbalance, syncope and seizures. The remainder of the  review of systems is noncontributory.  Physical Exam: BP 105/73   Pulse (!) 105   Temp (!) 97 F (36.1 C)   Ht 5' 6 (1.676 m)   Wt 150 lb 6.4 oz (68.2 kg)   BMI 24.28 kg/m  General:   Alert and oriented. No distress noted. Pleasant and cooperative.  Head:  Normocephalic and atraumatic. Eyes:  Conjuctiva clear without scleral icterus. Mouth:  Oral mucosa pink and moist. Good dentition. No lesions. Heart: Normal rate and rhythm, s1 and s2 heart sounds present.  Lungs: Clear lung sounds in all lobes. Respirations equal and unlabored. Abdomen:  +BS, soft, non-tender and non-distended. No rebound or guarding. No HSM or masses noted. Rectal: Jamie bruns LPN present as witness, no obvious lesions, masses present. No blood noted on exam, DRE with normal sphincter tone, tolerated well by the patient.  Derm: No palmar erythema or jaundice Msk:  Symmetrical without gross deformities. Normal posture. Extremities:  Without edema. Neurologic:  Alert and  oriented x4 Psych:  Alert and cooperative. Normal mood and affect.  Invalid input(s): 6 MONTHS   ASSESSMENT: Marjon Doxtater Steward is a 68 y.o. female presenting today for new onset rectal bleeding  New onset painless rectal bleeding, notes constipation has been mostly under control. Had about 6 episodes of diarrhea after symptoms began but this has resolved. Last BM today without Blood. Denies significant straining or constipation leading up to episodes of bleeding. Imaging of the abdomen in August 2024 was unremarkable and last TCS in 2021 with one small polyp in transverse colon. She does have history of hemorrhoids, on rectal exam today without any obvious hemorrhoids or other sources of bleeding. At this time, differentials include bleeding hemorrhoids, bleeding polyps, AVMs in the colon, diverticular bleed, cannot rule out malignancy  though less likely. Given last TCS was 4 years ago, would recommend updating colonoscopy for further  evaluation. Indications, risks and benefits of procedure discussed in detail with patient. Patient verbalized understanding and is in agreement to proceed with colonoscopy    PLAN:  -continue to use miralax  as needed -avoid straining, limit toileting time -schedule Colonoscopy ASA II  -pt to make me aware of worsening rectal bleeding, recurrence of diarrhea or onset of abdominal pain, would need to obtain stool studies for diarrhea, abdominal imaging if abdominal pain occurs   All questions were answered, patient verbalized understanding and is in agreement with plan as outlined above.   Follow Up: 3 months   Tung Pustejovsky L. Mariette, MSN, APRN, AGNP-C Adult-Gerontology Nurse Practitioner [facility]  I have reviewed the note and agree with the APP's assessment as described in this progress note  Toribio Fortune, MD Gastroenterology and Hepatology [facility]

## 2024-09-08 ENCOUNTER — Encounter (HOSPITAL_COMMUNITY)
Admission: RE | Admit: 2024-09-08 | Discharge: 2024-09-08 | Disposition: A | Discharge status: Home / Self Care | Source: Ambulatory Visit | Attending: Gastroenterology | Admitting: Gastroenterology

## 2024-09-08 ENCOUNTER — Other Ambulatory Visit: Payer: Self-pay

## 2024-09-08 ENCOUNTER — Encounter (HOSPITAL_COMMUNITY): Payer: Self-pay

## 2024-09-08 HISTORY — DX: Gastro-esophageal reflux disease without esophagitis: K21.9

## 2024-09-11 NOTE — Telephone Encounter (Signed)
 Patient left vm asking about clear liquids. I returned that patients call and gave her some clear liquids that she could have.

## 2024-09-12 ENCOUNTER — Encounter (HOSPITAL_COMMUNITY)
Admission: RE | Disposition: A | Discharge status: Home / Self Care | Payer: Self-pay | Source: Home / Self Care | Attending: Gastroenterology

## 2024-09-12 ENCOUNTER — Ambulatory Visit (HOSPITAL_COMMUNITY)
Admission: RE | Admit: 2024-09-12 | Discharge: 2024-09-12 | Disposition: A | Discharge status: Home / Self Care | Attending: Gastroenterology | Admitting: Gastroenterology

## 2024-09-12 ENCOUNTER — Ambulatory Visit (HOSPITAL_COMMUNITY): Admitting: Anesthesiology

## 2024-09-12 ENCOUNTER — Encounter (HOSPITAL_COMMUNITY): Payer: Self-pay | Admitting: Gastroenterology

## 2024-09-12 ENCOUNTER — Ambulatory Visit (HOSPITAL_BASED_OUTPATIENT_CLINIC_OR_DEPARTMENT_OTHER): Admitting: Anesthesiology

## 2024-09-12 DIAGNOSIS — K635 Polyp of colon: Secondary | ICD-10-CM

## 2024-09-12 DIAGNOSIS — K625 Hemorrhage of anus and rectum: Secondary | ICD-10-CM | POA: Diagnosis not present

## 2024-09-12 DIAGNOSIS — K219 Gastro-esophageal reflux disease without esophagitis: Secondary | ICD-10-CM | POA: Diagnosis not present

## 2024-09-12 DIAGNOSIS — I10 Essential (primary) hypertension: Secondary | ICD-10-CM | POA: Diagnosis not present

## 2024-09-12 DIAGNOSIS — D122 Benign neoplasm of ascending colon: Secondary | ICD-10-CM | POA: Diagnosis not present

## 2024-09-12 DIAGNOSIS — K648 Other hemorrhoids: Secondary | ICD-10-CM

## 2024-09-12 DIAGNOSIS — Z79899 Other long term (current) drug therapy: Secondary | ICD-10-CM | POA: Diagnosis not present

## 2024-09-12 DIAGNOSIS — K644 Residual hemorrhoidal skin tags: Secondary | ICD-10-CM | POA: Diagnosis not present

## 2024-09-12 HISTORY — PX: COLONOSCOPY: SHX5424

## 2024-09-12 LAB — HM COLONOSCOPY

## 2024-09-12 SURGERY — COLONOSCOPY
Anesthesia: General

## 2024-09-12 MED ORDER — LIDOCAINE 2% (20 MG/ML) 5 ML SYRINGE
INTRAMUSCULAR | Status: DC | PRN
  Administered 2024-09-12: 50 mg via INTRAVENOUS

## 2024-09-12 MED ORDER — LACTATED RINGERS IV SOLN: INTRAVENOUS | Status: DC

## 2024-09-12 MED ORDER — PROPOFOL 500 MG/50ML IV EMUL
INTRAVENOUS | Status: DC | PRN
  Administered 2024-09-12: 150 ug/kg/min via INTRAVENOUS

## 2024-09-12 MED ORDER — PROPOFOL 10 MG/ML IV BOLUS
INTRAVENOUS | Status: DC | PRN
  Administered 2024-09-12: 75 mg via INTRAVENOUS

## 2024-09-12 NOTE — Discharge Instructions (Signed)
 You are being discharged to home.  Resume your previous diet.  We are waiting for your pathology results.  Your physician has recommended a repeat colonoscopy for surveillance based on pathology results.

## 2024-09-12 NOTE — Op Note (Signed)
 [facility] Patient Name: Jamie Alexander Procedure Date: 09/12/2024 10:34 AM MRN: [id number] Date of Birth: May 31, 1956 Attending MD: Toribio Fortune , , [id number] CSN: [id number] Age: 68 Admit Type: Outpatient Procedure:                Colonoscopy Indications:              Rectal bleeding Providers:                Toribio Fortune, Jon LABOR. Gerome RN, RN, Jon Loge Referring MD:              Medicines:                Monitored Anesthesia Care Complications:            No immediate complications. Estimated Blood Loss:     Estimated blood loss: none. Procedure:                Pre-Anesthesia Assessment:                           - Prior to the procedure, a History and Physical                            was performed, and patient medications, allergies                            and sensitivities were reviewed. The patient's                            tolerance of previous anesthesia was reviewed.                           - The risks and benefits of the procedure and the                            sedation options and risks were discussed with the                            patient. All questions were answered and informed                            consent was obtained.                           - ASA Grade Assessment: II - A patient with mild                            systemic disease.                           After obtaining informed consent, the colonoscope                            was passed under direct vision. Throughout the  procedure, the patient's blood pressure, pulse, and                            oxygen saturations were monitored continuously. The                            [id number]-[id number] ([id number]) Peds Colon was introduced                            through the anus and advanced to the the terminal                            ileum. The colonoscopy was performed without                             difficulty. The patient tolerated the procedure                            well. The quality of the bowel preparation was good. Scope In: 10:53:34 AM Scope Out: 11:09:05 AM Scope Withdrawal Time: 0 hours 11 minutes 9 seconds  Total Procedure Duration: 0 hours 15 minutes 31 seconds  Findings:      Hemorrhoids were found on perianal exam.      The terminal ileum appeared normal.      A 2 mm polyp was found in the ascending colon. The polyp was sessile.       The polyp was removed with a cold snare. Resection and retrieval were       complete.      Non-bleeding external and internal hemorrhoids were found during       retroflexion and during perianal exam. The hemorrhoids were small.      Rectal bleeding source is hemorrhoidal. Impression:               - Hemorrhoids found on perianal exam.                           - The examined portion of the ileum was normal.                           - One 2 mm polyp in the ascending colon, removed                            with a cold snare. Resected and retrieved.                           - Non-bleeding external and internal hemorrhoids. Moderate Sedation:      Per Anesthesia Care Recommendation:           - Discharge patient to home (ambulatory).                           - Resume previous diet.                           - Await pathology results.                           -  Repeat colonoscopy for surveillance based on                            pathology results. Procedure Code(s):        --- Professional ---                           [phone], Colonoscopy, flexible; with removal of                            tumor(s), polyp(s), or other lesion(s) by snare                            technique Diagnosis Code(s):        --- Professional ---                           K64.8, Other hemorrhoids                           D12.2, Benign neoplasm of ascending colon                           K62.5, Hemorrhage of anus and rectum CPT copyright 2022  American Medical Association. All rights reserved. The codes documented in this report are preliminary and upon coder review may  be revised to meet current compliance requirements. Toribio Fortune, MD Toribio Fortune,  09/12/2024 11:15:33 AM This report has been signed electronically. Number of Addenda: 0

## 2024-09-12 NOTE — Anesthesia Postprocedure Evaluation (Signed)
 Anesthesia Post Note  Patient: Jamie Alexander  Procedure(s) Performed: COLONOSCOPY  Patient location during evaluation: Short Stay Anesthesia Type: General Level of consciousness: awake and alert Pain management: pain level controlled Vital Signs Assessment: post-procedure vital signs reviewed and stable Respiratory status: spontaneous breathing Cardiovascular status: blood pressure returned to baseline and stable Postop Assessment: no apparent nausea or vomiting Anesthetic complications: no   No notable events documented.   Last Vitals:  Vitals:   09/12/24 1117 09/12/24 1122  BP: 95/60 119/68  Pulse: 71   Resp: 19   Temp: 36.6 C   SpO2: 100%     Last Pain:  Vitals:   09/12/24 1117  TempSrc: Oral  PainSc: 0-No pain                 Franci Oshana

## 2024-09-12 NOTE — Transfer of Care (Signed)
 Immediate Anesthesia Transfer of Care Note  Patient: Jamie Alexander  Procedure(s) Performed: COLONOSCOPY  Patient Location: Short Stay  Anesthesia Type:General  Level of Consciousness: awake  Airway & Oxygen Therapy: Patient Spontanous Breathing  Post-op Assessment: Report given to RN  Post vital signs: Reviewed and stable  Last Vitals:  Vitals Value Taken Time  BP 95/60 09/12/24 11:17  Temp 36.6 C 09/12/24 11:17  Pulse 71 09/12/24 11:17  Resp 19 09/12/24 11:17  SpO2 100 % 09/12/24 11:17    Last Pain:  Vitals:   09/12/24 1117  TempSrc: Oral  PainSc: 0-No pain         Complications: No notable events documented.

## 2024-09-12 NOTE — Anesthesia Preprocedure Evaluation (Signed)
 Anesthesia Evaluation  Patient identified by MRN, date of birth, ID band Patient awake    Reviewed: Allergy & Precautions, H&P , NPO status , Patient's Chart, lab work & pertinent test results, reviewed documented beta blocker date and time   Airway Mallampati: II  TM Distance: >3 FB Neck ROM: full    Dental no notable dental hx.    Pulmonary neg pulmonary ROS   Pulmonary exam normal breath sounds clear to auscultation       Cardiovascular Exercise Tolerance: Good hypertension, negative cardio ROS  Rhythm:regular Rate:Normal     Neuro/Psych negative neurological ROS  negative psych ROS   GI/Hepatic Neg liver ROS,GERD  ,,  Endo/Other  negative endocrine ROS    Renal/GU negative Renal ROS  negative genitourinary   Musculoskeletal   Abdominal   Peds  Hematology negative hematology ROS (+)   Anesthesia Other Findings   Reproductive/Obstetrics negative OB ROS                             Anesthesia Physical Anesthesia Plan  ASA: 2  Anesthesia Plan: General   Post-op Pain Management:    Induction:   PONV Risk Score and Plan: Propofol infusion  Airway Management Planned:   Additional Equipment:   Intra-op Plan:   Post-operative Plan:   Informed Consent: I have reviewed the patients History and Physical, chart, labs and discussed the procedure including the risks, benefits and alternatives for the proposed anesthesia with the patient or authorized representative who has indicated his/her understanding and acceptance.     Dental Advisory Given  Plan Discussed with: CRNA  Anesthesia Plan Comments:        Anesthesia Quick Evaluation

## 2024-09-12 NOTE — Interval H&P Note (Signed)
 History and Physical Interval Note:  09/12/2024 9:51 AM  Jamie Alexander  has presented today for surgery, with the diagnosis of rectal bleeding.  The various methods of treatment have been discussed with the patient and family. After consideration of risks, benefits and other options for treatment, the patient has consented to  Procedure(s) with comments: COLONOSCOPY (N/A) - 11:15am, ASA 2 as a surgical intervention.  The patient's history has been reviewed, patient examined, no change in status, stable for surgery.  I have reviewed the patient's chart and labs.  Questions were answered to the patient's satisfaction.     [name]

## 2024-09-13 ENCOUNTER — Ambulatory Visit (INDEPENDENT_AMBULATORY_CARE_PROVIDER_SITE_OTHER): Payer: Self-pay | Admitting: Gastroenterology

## 2024-09-13 ENCOUNTER — Encounter (INDEPENDENT_AMBULATORY_CARE_PROVIDER_SITE_OTHER): Payer: Self-pay | Admitting: *Deleted

## 2024-09-13 LAB — SURGICAL PATHOLOGY

## 2024-09-14 ENCOUNTER — Encounter (HOSPITAL_COMMUNITY): Payer: Self-pay | Admitting: Gastroenterology

## 2024-09-19 NOTE — Progress Notes (Signed)
 10 yr TCS noted in recall Patient result letter mailed procedure note and pathology result faxed to PCP

## 2024-10-02 ENCOUNTER — Other Ambulatory Visit (INDEPENDENT_AMBULATORY_CARE_PROVIDER_SITE_OTHER): Payer: Self-pay | Admitting: Gastroenterology

## 2024-10-10 DIAGNOSIS — Z299 Encounter for prophylactic measures, unspecified: Secondary | ICD-10-CM | POA: Diagnosis not present

## 2024-10-10 DIAGNOSIS — Z0000 Encounter for general adult medical examination without abnormal findings: Secondary | ICD-10-CM | POA: Diagnosis not present

## 2024-10-10 DIAGNOSIS — R252 Cramp and spasm: Secondary | ICD-10-CM | POA: Diagnosis not present

## 2024-10-10 DIAGNOSIS — R52 Pain, unspecified: Secondary | ICD-10-CM | POA: Diagnosis not present

## 2024-10-10 DIAGNOSIS — I10 Essential (primary) hypertension: Secondary | ICD-10-CM | POA: Diagnosis not present

## 2024-10-25 ENCOUNTER — Encounter (INDEPENDENT_AMBULATORY_CARE_PROVIDER_SITE_OTHER): Payer: Self-pay | Admitting: Gastroenterology

## 2024-12-07 ENCOUNTER — Ambulatory Visit (INDEPENDENT_AMBULATORY_CARE_PROVIDER_SITE_OTHER): Admitting: Gastroenterology
